# Patient Record
Sex: Male | Born: 1970 | Race: Black or African American | Hispanic: No | Marital: Married | Smoking: Current some day smoker
Health system: Southern US, Community
[De-identification: ages and names within clinical notes are randomized; demographics above are authoritative.]

## PROBLEM LIST (undated history)

## (undated) DIAGNOSIS — D573 Sickle-cell trait: Secondary | ICD-10-CM

## (undated) DIAGNOSIS — M109 Gout, unspecified: Secondary | ICD-10-CM

## (undated) DIAGNOSIS — I10 Essential (primary) hypertension: Secondary | ICD-10-CM

## (undated) HISTORY — DX: Sickle-cell trait: D57.3

## (undated) HISTORY — PX: NECK SURGERY: SHX720

---

## 2015-05-18 ENCOUNTER — Emergency Department (HOSPITAL_COMMUNITY)
Admission: EM | Admit: 2015-05-18 | Discharge: 2015-05-19 | Disposition: A | Discharge status: Home / Self Care | Payer: 59 | Attending: Emergency Medicine | Admitting: Emergency Medicine

## 2015-05-18 ENCOUNTER — Emergency Department (HOSPITAL_COMMUNITY): Payer: 59

## 2015-05-18 ENCOUNTER — Encounter (HOSPITAL_COMMUNITY): Payer: Self-pay | Admitting: *Deleted

## 2015-05-18 DIAGNOSIS — Y92410 Unspecified street and highway as the place of occurrence of the external cause: Secondary | ICD-10-CM | POA: Insufficient documentation

## 2015-05-18 DIAGNOSIS — F17210 Nicotine dependence, cigarettes, uncomplicated: Secondary | ICD-10-CM | POA: Insufficient documentation

## 2015-05-18 DIAGNOSIS — S3992XA Unspecified injury of lower back, initial encounter: Secondary | ICD-10-CM | POA: Insufficient documentation

## 2015-05-18 DIAGNOSIS — I10 Essential (primary) hypertension: Secondary | ICD-10-CM | POA: Insufficient documentation

## 2015-05-18 DIAGNOSIS — S2231XA Fracture of one rib, right side, initial encounter for closed fracture: Secondary | ICD-10-CM | POA: Insufficient documentation

## 2015-05-18 DIAGNOSIS — Y9389 Activity, other specified: Secondary | ICD-10-CM | POA: Diagnosis not present

## 2015-05-18 DIAGNOSIS — Y998 Other external cause status: Secondary | ICD-10-CM | POA: Diagnosis not present

## 2015-05-18 DIAGNOSIS — S0990XA Unspecified injury of head, initial encounter: Secondary | ICD-10-CM | POA: Diagnosis present

## 2015-05-18 DIAGNOSIS — S12691A Other nondisplaced fracture of seventh cervical vertebra, initial encounter for closed fracture: Secondary | ICD-10-CM | POA: Diagnosis not present

## 2015-05-18 DIAGNOSIS — S129XXA Fracture of neck, unspecified, initial encounter: Secondary | ICD-10-CM

## 2015-05-18 DIAGNOSIS — T1490XA Injury, unspecified, initial encounter: Secondary | ICD-10-CM

## 2015-05-18 DIAGNOSIS — S060X9A Concussion with loss of consciousness of unspecified duration, initial encounter: Secondary | ICD-10-CM | POA: Diagnosis not present

## 2015-05-18 HISTORY — DX: Essential (primary) hypertension: I10

## 2015-05-18 LAB — I-STAT CHEM 8, ED
BUN: 15 mg/dL (ref 6–20)
CALCIUM ION: 1.11 mmol/L — AB (ref 1.12–1.23)
CHLORIDE: 102 mmol/L (ref 101–111)
Creatinine, Ser: 1.5 mg/dL — ABNORMAL HIGH (ref 0.61–1.24)
Glucose, Bld: 106 mg/dL — ABNORMAL HIGH (ref 65–99)
HCT: 41 % (ref 39.0–52.0)
HEMOGLOBIN: 13.9 g/dL (ref 13.0–17.0)
POTASSIUM: 4.1 mmol/L (ref 3.5–5.1)
SODIUM: 141 mmol/L (ref 135–145)
TCO2: 26 mmol/L (ref 0–100)

## 2015-05-18 LAB — CBC WITH DIFFERENTIAL/PLATELET
BASOS ABS: 0.1 10*3/uL (ref 0.0–0.1)
Basophils Relative: 1 %
EOS ABS: 0.2 10*3/uL (ref 0.0–0.7)
EOS PCT: 2 %
HCT: 37.3 % — ABNORMAL LOW (ref 39.0–52.0)
Hemoglobin: 12.5 g/dL — ABNORMAL LOW (ref 13.0–17.0)
Lymphocytes Relative: 32 %
Lymphs Abs: 2.6 10*3/uL (ref 0.7–4.0)
MCH: 31.1 pg (ref 26.0–34.0)
MCHC: 33.5 g/dL (ref 30.0–36.0)
MCV: 92.8 fL (ref 78.0–100.0)
Monocytes Absolute: 0.5 10*3/uL (ref 0.1–1.0)
Monocytes Relative: 7 %
Neutro Abs: 4.8 10*3/uL (ref 1.7–7.7)
Neutrophils Relative %: 58 %
PLATELETS: 178 10*3/uL (ref 150–400)
RBC: 4.02 MIL/uL — AB (ref 4.22–5.81)
RDW: 11.8 % (ref 11.5–15.5)
WBC: 8.2 10*3/uL (ref 4.0–10.5)

## 2015-05-18 LAB — ETHANOL: ALCOHOL ETHYL (B): 255 mg/dL — AB (ref <5)

## 2015-05-18 MED ORDER — IOPAMIDOL (ISOVUE-300) INJECTION 61%
INTRAVENOUS | Status: AC
  Administered 2015-05-18: 100 mL
  Filled 2015-05-18: qty 100
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
## 2015-05-19 ENCOUNTER — Emergency Department (HOSPITAL_COMMUNITY): Payer: 59

## 2015-05-19 ENCOUNTER — Encounter (HOSPITAL_COMMUNITY): Payer: Self-pay | Admitting: Radiology

## 2015-05-19 MED ORDER — AMLODIPINE BESYLATE 5 MG PO TABS: 5.0000 mg | ORAL_TABLET | Freq: Once | ORAL | Status: DC

## 2015-05-19 MED ORDER — IOPAMIDOL (ISOVUE-370) INJECTION 76%
INTRAVENOUS | Status: AC
  Administered 2015-05-19: 50 mL
  Filled 2015-05-19: qty 50

## 2015-05-19 MED ORDER — FENTANYL CITRATE (PF) 100 MCG/2ML IJ SOLN
50.0000 ug | Freq: Once | INTRAMUSCULAR | Status: AC
  Administered 2015-05-19: 50 ug via INTRAVENOUS
  Filled 2015-05-19: qty 2

## 2015-05-19 MED ORDER — AMLODIPINE BESYLATE 10 MG PO TABS: 10.0000 mg | ORAL_TABLET | Freq: Every day | ORAL | Status: DC

## 2015-05-19 MED ORDER — ONDANSETRON 4 MG PO TBDP: 4.0000 mg | ORAL_TABLET | Freq: Three times a day (TID) | ORAL | Status: DC | PRN

## 2015-05-19 MED ORDER — FENTANYL CITRATE (PF) 100 MCG/2ML IJ SOLN
50.0000 ug | Freq: Once | INTRAMUSCULAR | Status: AC
Start: 2015-05-19 — End: 2015-05-19
  Administered 2015-05-19: 50 ug via INTRAVENOUS
  Filled 2015-05-19: qty 2

## 2015-05-19 MED ORDER — HYDROMORPHONE HCL 1 MG/ML IJ SOLN
1.0000 mg | Freq: Once | INTRAMUSCULAR | Status: AC
  Administered 2015-05-19: 1 mg via INTRAVENOUS
  Filled 2015-05-19: qty 1

## 2015-05-19 MED ORDER — SODIUM CHLORIDE 0.9 % IV BOLUS (SEPSIS)
1000.0000 mL | Freq: Once | INTRAVENOUS | Status: AC
  Administered 2015-05-19: 1000 mL via INTRAVENOUS

## 2015-05-19 MED ORDER — OXYCODONE-ACETAMINOPHEN 5-325 MG PO TABS: 1.0000 | ORAL_TABLET | Freq: Four times a day (QID) | ORAL | Status: DC | PRN

## 2015-05-19 MED ORDER — DOCUSATE SODIUM 100 MG PO CAPS: 100.0000 mg | ORAL_CAPSULE | Freq: Two times a day (BID) | ORAL | Status: DC

## 2015-05-19 MED ORDER — AMLODIPINE BESYLATE 5 MG PO TABS
10.0000 mg | ORAL_TABLET | Freq: Once | ORAL | Status: AC
  Administered 2015-05-19: 10 mg via ORAL
  Filled 2015-05-19: qty 2

## 2015-05-19 NOTE — ED Notes (Addendum)
Patient ambulated to the nurses station and back to the bed without difficulty  Tolerated well
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
## 2015-05-19 NOTE — ED Notes (Signed)
Discharge instructions and prescriptions reviewed with wife - voiced understanding  Instructed to follow up with neuro

## 2015-05-19 NOTE — Discharge Instructions (Signed)
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 have:  Numbness.  Tingling.  Weakness.  Loss of movement.  Loss of bowel or bladder control. DIAGNOSIS This injury may be diagnosed based on symptoms, medical history, and a physical exam.  You may also have imaging tests to confirm the diagnosis. These may include:  Spine X-ray.  CT scan.  MRI. TREATMENT Treatment for this injury depends on the type of fracture. If your fracture is stable and does not affect your spinal cord, it may heal with nonsurgical treatment, such as:  Taking pain medicine.  Wearing a cast or a brace.  Doing physical therapy exercises. If your vertebral fracture is unstable or it affects your spinal cord, you may need surgical treatment, such as:  Laminectomy. This procedure involves removing the part of a vertebra that is pushing on the spinal cord (spinal decompression surgery). Bone fragments may also be removed.  Spinal fusion.  This procedure is used to stabilize an unstable fracture. Vertebrae may be joined together with a piece of bone from another part of your body (graft) and held in place with rods, plates, or screws.  Vertebroplasty. In this procedure, bone cement is used to rebuild collapsed vertebrae. HOME CARE INSTRUCTIONS General Instructions  Take medicines only as directed by your health care provider.  Do not drive or operate heavy machinery while taking pain medicine.  If directed, apply ice to the injured area:  Put ice in a plastic bag.  Place a towel between your skin and the bag.  Leave the ice on for 30 minutes every two hours at first. Then apply the ice as needed.  Wear your neck brace or back brace as directed by your health care provider.  Do not drink alcohol. Alcohol can interfere with your treatment.  Keep all follow-up visits as directed by your health care provider. This is important. It can help to prevent permanent injury, disability, and long-lasting (chronic) pain. Activity  Stay in bed (on bed rest) only as directed by your health care provider. Being on bed rest for too long can make your condition worse.  Return to your normal activities as directed by your health care provider. Ask what activities are safe for you.  Do exercises to improve motion and strength in your back (physical therapy), as recommended by your health care provider.   Exercise regularly as directed by your health care provider. SEEK MEDICAL CARE IF:  You have a fever.  You develop a cough that makes your pain worse.  Your pain medicine is not helping.  Your pain does not get better over time.  You cannot return to your normal activities as planned or expected. SEEK IMMEDIATE MEDICAL CARE IF:  Your pain is very bad and it suddenly gets worse.  You are unable to move any body part (paralysis) that is below the level of your injury.  You have numbness, tingling, or weakness in any body  part that is below the level of your injury.  You cannot control your bladder or bowels.   This information is not intended to replace advice given to you by your health care provider. Make sure you discuss any questions you have with your health care provider.   Document Released: 03/04/2004 Document Revised: 06/11/2014 Document Reviewed: 01/30/2014 Elsevier Interactive Patient Education [employer]    To find a primary care or specialty doctor please call [phone] or [phone] to access "[place] Find a Doctor Service."  You may also go on the [facility] website at [email or web address]  There are also multiple [facility] and [facility] practices throughout the [place] that are frequently accepting new patients. You may find a clinic that is close to your home and contact them.  [facility] -  [address] [place] [address] [phone]  [facility] in [place] (also locations in [place] and [place]) -  [address] [place] [address] [phone]  [facility] -  [address] [place] [address] [phone]

## 2015-05-19 NOTE — ED Notes (Signed)
Pt taken to CT.

## 2015-06-26 ENCOUNTER — Other Ambulatory Visit: Payer: Self-pay | Admitting: Neurosurgery

## 2015-06-26 DIAGNOSIS — S12601A Unspecified nondisplaced fracture of seventh cervical vertebra, initial encounter for closed fracture: Secondary | ICD-10-CM

## 2015-07-04 ENCOUNTER — Ambulatory Visit
Admission: RE | Admit: 2015-07-04 | Discharge: 2015-07-04 | Disposition: A | Discharge status: Home / Self Care | Payer: 59 | Source: Ambulatory Visit | Attending: Neurosurgery | Admitting: Neurosurgery

## 2015-07-04 DIAGNOSIS — S12601A Unspecified nondisplaced fracture of seventh cervical vertebra, initial encounter for closed fracture: Secondary | ICD-10-CM

## 2016-05-01 ENCOUNTER — Emergency Department (HOSPITAL_BASED_OUTPATIENT_CLINIC_OR_DEPARTMENT_OTHER)
Admission: EM | Admit: 2016-05-01 | Discharge: 2016-05-01 | Disposition: A | Discharge status: Home / Self Care | Payer: Commercial Managed Care - PPO | Attending: Emergency Medicine | Admitting: Emergency Medicine

## 2016-05-01 ENCOUNTER — Emergency Department (HOSPITAL_BASED_OUTPATIENT_CLINIC_OR_DEPARTMENT_OTHER): Payer: Commercial Managed Care - PPO

## 2016-05-01 ENCOUNTER — Encounter (HOSPITAL_BASED_OUTPATIENT_CLINIC_OR_DEPARTMENT_OTHER): Payer: Self-pay | Admitting: Emergency Medicine

## 2016-05-01 DIAGNOSIS — F17290 Nicotine dependence, other tobacco product, uncomplicated: Secondary | ICD-10-CM | POA: Diagnosis not present

## 2016-05-01 DIAGNOSIS — Z79899 Other long term (current) drug therapy: Secondary | ICD-10-CM | POA: Insufficient documentation

## 2016-05-01 DIAGNOSIS — I10 Essential (primary) hypertension: Secondary | ICD-10-CM | POA: Insufficient documentation

## 2016-05-01 DIAGNOSIS — M79641 Pain in right hand: Secondary | ICD-10-CM | POA: Insufficient documentation

## 2016-05-01 HISTORY — DX: Gout, unspecified: M10.9

## 2016-05-01 MED ORDER — COLCHICINE 0.6 MG PO TABS
1.2000 mg | ORAL_TABLET | Freq: Once | ORAL | Status: AC
  Administered 2016-05-01: 1.2 mg via ORAL
  Filled 2016-05-01: qty 2

## 2016-05-01 MED ORDER — PREDNISONE 20 MG PO TABS: 40.0000 mg | ORAL_TABLET | Freq: Every day | ORAL | 0 refills | Status: DC

## 2016-05-01 MED ORDER — AMLODIPINE BESYLATE 10 MG PO TABS: 10.0000 mg | ORAL_TABLET | Freq: Every day | ORAL | 1 refills | Status: DC

## 2016-05-01 MED ORDER — HYDROCODONE-ACETAMINOPHEN 5-325 MG PO TABS: 1.0000 | ORAL_TABLET | Freq: Four times a day (QID) | ORAL | 0 refills | Status: DC | PRN

## 2016-05-01 MED ORDER — COLCHICINE 0.6 MG PO TABS: 0.6000 mg | ORAL_TABLET | Freq: Every day | ORAL | 0 refills | Status: DC

## 2016-05-01 NOTE — ED Triage Notes (Signed)
Swelling and pain to right thumb , states has gout and feels like prior gout pain but has only had flare ups to toes. Has not been on BP meds for a year

## 2016-05-01 NOTE — ED Provider Notes (Signed)
[facility] Provider Note   CSN: [id number] Arrival date & time: 05/01/16  0840     History   Chief Complaint Chief Complaint  Patient presents with  . Hand Pain    HPI Miguel Harrison is a 46 y.o. male.  HPI Miguel Harrison is a 46 y.o. male with history of gout and hypertension, presents to emergency department complaining of right wrist and right hand pain. Patient states symptoms started several weeks ago. It gradually gotten worse. Pain started in his right thumb and now radiates into the hand and into the wrist and forearm. Patient has swelling in that hand. Pain with movement at the wrist and all fingers. He has been taking Tylenol for this pain with no relief of his symptoms. He denies any injuries. He states he does do a lot of lifting and working with his hands. He reports that this pain is similar to a gout pain that he has had "numerous of times" in his feet. He denies ever having a flareup in his hand however. He denies any numbness or tingling in his fingers. Any movement or palpation makes pain worse. Nothing makes it better.  He denies any fever or chills. He denies any other associated symptoms.  Past Medical History:  Diagnosis Date  . Gout   . Hypertension     There are no active problems to display for this patient.   History reviewed. No pertinent surgical history.     Home Medications    Prior to Admission medications   Medication Sig Start Date End Date Taking? Authorizing Provider  amLODipine (NORVASC) 10 MG tablet Take 1 tablet (10 mg total) by mouth daily. 05/19/15   Kristen N Ward, DO  docusate sodium (COLACE) 100 MG capsule Take 1 capsule (100 mg total) by mouth every 12 (twelve) hours. 05/19/15   Kristen N Ward, DO  ibuprofen (ADVIL,MOTRIN) 200 MG tablet Take 200 mg by mouth every 6 (six) hours as needed for mild pain.    Historical Provider, MD  ondansetron (ZOFRAN ODT) 4 MG disintegrating tablet Take 1 tablet (4 mg total) by  mouth every 8 (eight) hours as needed for nausea or vomiting. 05/19/15   Kristen N Ward, DO  oxyCODONE-acetaminophen (PERCOCET/ROXICET) 5-325 MG tablet Take 1-2 tablets by mouth every 6 (six) hours as needed. 05/19/15   Layla Maw Ward, DO    Family History No family history on file.  Social History Social History  Substance Use Topics  . Smoking status: Current Some Day Smoker    Types: Cigars  . Smokeless tobacco: Never Used  . Alcohol use Yes     Comment: daily     Allergies   Patient has no known allergies.   Review of Systems Review of Systems  Constitutional: Negative for chills and fever.  Musculoskeletal: Positive for arthralgias and joint swelling.  Neurological: Negative for weakness and numbness.     Physical Exam Updated Vital Signs BP (!) 170/96 (BP Location: Left Arm)   Pulse 88   Temp 98.3 F (36.8 C) (Oral)   Resp 14   Ht 5\' 11"  (1.803 m)   Wt 88.5 kg   SpO2 100%   BMI 27.20 kg/m   Physical Exam  Constitutional: He appears well-developed and well-nourished. No distress.  HENT:  Head: Normocephalic and atraumatic.  Eyes: Conjunctivae are normal.  Neck: Neck supple.  Cardiovascular: Normal rate, regular rhythm and normal heart sounds.   Pulmonary/Chest: Effort normal. No respiratory distress. He has no wheezes.  He has no rales.  Musculoskeletal: He exhibits no edema.  Obvious swelling noted to the right hand and wrist. There is no erythema. It is not warm to the touch. Tenderness to the palmar surface of the flexor tendons of fingers 1 through 5. Tenderness over anterior wrist. Full range of motion of all fingers, pain with full extension of all of the fingers and MCP joints. Pain with wrist flexion and extension, full range of motion of the wrist.  Neurological: He is alert.  Skin: Skin is warm and dry.  Nursing note and vitals reviewed.    ED Treatments / Results  Labs (all labs ordered are listed, but only abnormal results are  displayed) Labs Reviewed - No data to display  EKG  EKG Interpretation None       Radiology Dg Hand Complete Right  Result Date: 05/01/2016 CLINICAL DATA:  Three-day history of pain and swelling EXAM: RIGHT HAND - COMPLETE 3+ VIEW COMPARISON:  None. FINDINGS: Frontal, oblique, and lateral views were obtained. There is no appreciable fracture or dislocation. Joint spaces appear normal. No erosive change or periostitis. Bony mineralization is normal. IMPRESSION: No apparent arthropathic change.  No fracture or dislocation. Electronically Signed   By: Bretta BangWilliam  Woodruff III M.D.   On: 05/01/2016 09:46    Procedures Procedures (including critical care time)  Medications Ordered in ED Medications - No data to display   Initial Impression / Assessment and Plan / ED Course  I have reviewed the triage vital signs and the nursing notes.  Pertinent labs & imaging results that were available during my care of the patient were reviewed by me and considered in my medical decision making (see chart for details).     Patient in emergency department with a right hand pain and swelling. Those for several weeks. His x-ray is unremarkable. His hand is not warm to the touch, he denies any fever or chills at home. I doubt septic arthritis or tenosynovitis. There is no wounds or skin lesions to the hand. He states pain spell similar to prior gout. We will start him on colchicine, norco for pain, prednisone, splint applied, will follow up with pcp. Patient's blood pressure elevated in emergency department. He is asymptomatic for his hypertension. He has been off of his medications for almost a year. He has been taking Norvasc in the past. I will restart his blood pressure meds. Return precautions discussed.   Vitals:   05/01/16 0849  BP: (!) 170/96  Pulse: 88  Resp: 14  Temp: 98.3 F (36.8 C)     Final Clinical Impressions(s) / ED Diagnoses   Final diagnoses:  Right hand pain    New  Prescriptions New Prescriptions   AMLODIPINE (NORVASC) 10 MG TABLET    Take 1 tablet (10 mg total) by mouth daily.   COLCHICINE 0.6 MG TABLET    Take 1 tablet (0.6 mg total) by mouth daily.   HYDROCODONE-ACETAMINOPHEN (NORCO) 5-325 MG TABLET    Take 1 tablet by mouth every 6 (six) hours as needed for moderate pain.   PREDNISONE (DELTASONE) 20 MG TABLET    Take 2 tablets (40 mg total) by mouth daily.     Jaynie Crumbleatyana Jazlyne Gauger, PA-C 05/01/16 1007    Melene Planan Floyd, DO 05/01/16 1503

## 2016-05-01 NOTE — Discharge Instructions (Signed)
Splint on until improved. Avoid heavy lifting. Ice and elevate at home. Take one more dose of colchicine in 1 hr. Take prednisone as prescribed until all gone starting tomorrow. Take norco for pain as needed. Follow up if not improving.

## 2016-05-21 ENCOUNTER — Telehealth: Payer: Self-pay

## 2016-05-21 MED ORDER — COLCHICINE 0.6 MG PO TABS: 0.6000 mg | ORAL_TABLET | Freq: Every day | ORAL | 0 refills | Status: DC

## 2016-05-21 NOTE — Telephone Encounter (Signed)
He takes colchicine daily? Does he need more than 1?

## 2016-05-21 NOTE — Telephone Encounter (Signed)
OK 

## 2016-05-21 NOTE — Telephone Encounter (Signed)
Patient states he 2 in the hospital then took another one and then was sent home with only 1 tablet and was told to follow up with PCP.  Patient has new patient appointment with you on Monday.

## 2016-05-21 NOTE — Telephone Encounter (Signed)
Patient has new pt appt with you on Monday and needs gout meds sent to pharmacy    Please advise  PC

## 2016-05-21 NOTE — Telephone Encounter (Signed)
Medication has been faxed over to the pharmacy.   PC

## 2016-05-21 NOTE — Telephone Encounter (Signed)
Call back number [phone] Kara Dies (wife)

## 2016-05-21 NOTE — Telephone Encounter (Signed)
Spouse checking on the status of medication below [phone], please advise

## 2016-05-24 ENCOUNTER — Ambulatory Visit: Payer: 59 | Admitting: Family Medicine

## 2016-06-18 ENCOUNTER — Other Ambulatory Visit: Payer: Self-pay | Admitting: Family Medicine

## 2016-06-18 NOTE — Telephone Encounter (Signed)
Rx denied.  Pt has never been seen by Dr. Carmelia RollerWendling.  Pt canceled his appointment.  Pt needs to schedule an appointment.//AB/CMA

## 2016-11-14 ENCOUNTER — Encounter (HOSPITAL_BASED_OUTPATIENT_CLINIC_OR_DEPARTMENT_OTHER): Payer: Self-pay

## 2016-11-14 ENCOUNTER — Emergency Department (HOSPITAL_BASED_OUTPATIENT_CLINIC_OR_DEPARTMENT_OTHER)
Admission: EM | Admit: 2016-11-14 | Discharge: 2016-11-14 | Disposition: A | Discharge status: Home / Self Care | Payer: Commercial Managed Care - PPO | Attending: Emergency Medicine | Admitting: Emergency Medicine

## 2016-11-14 DIAGNOSIS — I10 Essential (primary) hypertension: Secondary | ICD-10-CM | POA: Insufficient documentation

## 2016-11-14 DIAGNOSIS — F17290 Nicotine dependence, other tobacco product, uncomplicated: Secondary | ICD-10-CM | POA: Insufficient documentation

## 2016-11-14 DIAGNOSIS — R2231 Localized swelling, mass and lump, right upper limb: Secondary | ICD-10-CM | POA: Diagnosis not present

## 2016-11-14 DIAGNOSIS — Z79899 Other long term (current) drug therapy: Secondary | ICD-10-CM | POA: Diagnosis not present

## 2016-11-14 DIAGNOSIS — M109 Gout, unspecified: Secondary | ICD-10-CM | POA: Diagnosis not present

## 2016-11-14 DIAGNOSIS — M79641 Pain in right hand: Secondary | ICD-10-CM | POA: Diagnosis present

## 2016-11-14 LAB — BASIC METABOLIC PANEL
ANION GAP: 7 (ref 5–15)
BUN: 15 mg/dL (ref 6–20)
CHLORIDE: 100 mmol/L — AB (ref 101–111)
CO2: 27 mmol/L (ref 22–32)
Calcium: 8.6 mg/dL — ABNORMAL LOW (ref 8.9–10.3)
Creatinine, Ser: 1.01 mg/dL (ref 0.61–1.24)
GFR calc non Af Amer: >60 mL/min (ref >60)
Glucose, Bld: 116 mg/dL — ABNORMAL HIGH (ref 65–99)
Potassium: 3.8 mmol/L (ref 3.5–5.1)
Sodium: 134 mmol/L — ABNORMAL LOW (ref 135–145)

## 2016-11-14 MED ORDER — AMLODIPINE BESYLATE 10 MG PO TABS: 10.0000 mg | ORAL_TABLET | Freq: Every day | ORAL | 0 refills | Status: DC

## 2016-11-14 MED ORDER — OXYCODONE-ACETAMINOPHEN 5-325 MG PO TABS: 1.0000 | ORAL_TABLET | Freq: Three times a day (TID) | ORAL | 0 refills | Status: DC | PRN

## 2016-11-14 MED ORDER — COLCHICINE 0.6 MG PO TABS: ORAL_TABLET | ORAL | 0 refills | Status: DC

## 2016-11-14 MED ORDER — OXYCODONE-ACETAMINOPHEN 5-325 MG PO TABS
1.0000 | ORAL_TABLET | Freq: Once | ORAL | Status: AC
  Administered 2016-11-14: 1 via ORAL
  Filled 2016-11-14: qty 1

## 2016-11-14 NOTE — Discharge Instructions (Signed)
Please take 2 tablets of colchicine, followed by one additional tablet the next hour today for your your gout flare. Starting tomorrow you may take one tablet in the morning and a second tablet at night (if needed) until your gout flare resolves. If you're still having pain and symptoms in the next 4 days, please call the number on her discharge paperwork to get established with a primary care provider for a recheck.  You may take Tylenol or ibuprofen as directed on the bottle for additional pain control. For severe pain he may take 1 tablet Percocet once every 8 hours. Please do not drive or work while taking this medication because it can cause you to be impaired and sleepy. Please note, this is a narcotic medication can be addicting so please transition to ibuprofen or Tylenol as soon as possible.  Your creatinine level, which is an indicator of her kidney function, was normal today. Your blood pressure was 181/100. Please call the number on her discharge paper to get established with a primary care provider. I have refilled your Norvasc for one month. Please follow-up with primary care to obtain additional refills of this medication.

## 2016-11-14 NOTE — ED Provider Notes (Signed)
[facility] Note   CSN: [id number] Arrival date & time: 11/14/16  0855     History   Chief Complaint Chief Complaint  Patient presents with  . Hand Pain    HPI Miguel Harrison is a 46 y.o. male with a history of hypertension and gout who presents to the emergency department with a chief complaint of constant, worsening pain and swelling to the right hand since yesterday. The patient reports this feels like one of his gout flares. He denies fever, chills, or recent trauma or injury to the right hand, wrist, or fingers. He reports the pain started over the third MCP, but has worsened and now includes the second MCP. No treatment PTA. He reports he is out of his gout medication and his HTN medication. No PCP.   The history is provided by the patient. No language interpreter was used.    Past Medical History:  Diagnosis Date  . Gout   . Hypertension     There are no active problems to display for this patient.   Past Surgical History:  Procedure Laterality Date  . NECK SURGERY         Home Medications    Prior to Admission medications   Medication Sig Start Date End Date Taking? Authorizing Provider  amLODipine (NORVASC) 10 MG tablet Take 1 tablet (10 mg total) by mouth daily. 11/14/16   Herron Fero A, PA-C  colchicine 0.6 MG tablet Day 1: Take 2 tablets followed by one additional tablet in 1 hours for a total of 3 tablets.  After the first day, take 1-2 tablets daily until your gout flare resolves. 11/14/16   Lauralie Blacksher A, PA-C  docusate sodium (COLACE) 100 MG capsule Take 1 capsule (100 mg total) by mouth every 12 (twelve) hours. 05/19/15   Ward, Layla Maw, DO  HYDROcodone-acetaminophen (NORCO) 5-325 MG tablet Take 1 tablet by mouth every 6 (six) hours as needed for moderate pain. 05/01/16   Kirichenko, Tatyana, PA-C  ibuprofen (ADVIL,MOTRIN) 200 MG tablet Take 200 mg by mouth every 6 (six) hours as needed for mild pain.    [provider]  ondansetron (ZOFRAN ODT) 4 MG disintegrating tablet Take 1 tablet (4 mg total) by mouth every 8 (eight) hours as needed for nausea or vomiting. 05/19/15   Ward, Layla Maw, DO  oxyCODONE-acetaminophen (PERCOCET/ROXICET) 5-325 MG tablet Take 1 tablet by mouth every 8 (eight) hours as needed for severe pain. 11/14/16   Jovante Hammitt A, PA-C  predniSONE (DELTASONE) 20 MG tablet Take 2 tablets (40 mg total) by mouth daily. 05/01/16   Jaynie Crumble, PA-C    Family History History reviewed. No pertinent family history.  Social History Social History  Substance Use Topics  . Smoking status: Current Some Day Smoker    Types: Cigars  . Smokeless tobacco: Never Used  . Alcohol use Yes     Comment: daily     Allergies   Patient has no known allergies.   Review of Systems Review of Systems  Constitutional: Negative for activity change, chills and fever.  Respiratory: Negative for shortness of breath.   Cardiovascular: Negative for chest pain.  Gastrointestinal: Negative for abdominal pain.  Musculoskeletal: Positive for arthralgias and joint swelling. Negative for back pain and myalgias.  Skin: Negative for rash.  Neurological: Negative for weakness and numbness.     Physical Exam Updated Vital Signs BP (!) 181/100 (BP Location: Left Arm)   Pulse 80   Temp 98.1 F (  36.7 C) (Oral)   Resp 16   Ht  (1.778 m)   Wt 89.8 kg (198 lb)   SpO2 98%   BMI 28.41 kg/m   Physical Exam  Constitutional: He appears well-developed.  HENT:  Head: Normocephalic.  Eyes: Conjunctivae are normal.  Neck: Neck supple.  Cardiovascular: Normal rate and regular rhythm.   No murmur heard. Pulmonary/Chest: Effort normal.  Abdominal: Soft. He exhibits no distension.  Musculoskeletal:  TPP over the right second and third MCP joints of the right hand. The joints are mildly warm and swollen. FROM of the digits on the hand. Radial pulses 2+ bilaterally. Sensation is intact throughout. FROM of  the elbow. Normal left hand and elbow exam.   Neurological: He is alert.  Skin: Skin is warm and dry.  Psychiatric: His behavior is normal.  Nursing note and vitals reviewed.    ED Treatments / Results  Labs (all labs ordered are listed, but only abnormal results are displayed) Labs Reviewed  BASIC METABOLIC PANEL - Abnormal; Notable for the following:       Result Value   Sodium 134 (*)    Chloride 100 (*)    Glucose, Bld 116 (*)    Calcium 8.6 (*)    All other components within normal limits    EKG  EKG Interpretation None       Radiology No results found.  Procedures Procedures (including critical care time)  Medications Ordered in ED Medications  oxyCODONE-acetaminophen (PERCOCET/ROXICET) 5-325 MG per tablet 1 tablet (1 tablet Oral Given 11/14/16 0930)     Initial Impression / Assessment and Plan / ED Course  I have reviewed the triage vital signs and the nursing notes.  Pertinent labs & imaging results that were available during my care of the patient were reviewed by me and considered in my medical decision making (see chart for details).     46 year old male with a history of gout and hypertension. His blood pressure is 181/100 today. He reports that he has been out of his Norvasc for several months because he has not been reestablished with a primary care provider. When the patient was last evaluated in the ED in April 2017, the patient's creatinine was 1.5. The patient denies a history of renal disease. We will repeat a BMP today prior to discharge for gout; Cr 1.01. Discussed with the patient that this does not appear to be a septic joint, but only an arthrocentesis would rule it out; however I have a low suspicion for gout at this time. He denies arthrocentesis at this time. Will d/c to home with pain medication and colchicine. A 42-month prescription history query was performed using the [place] CSRS prior to discharge. The patient was also given a one month  supply of Norvasc. Encouraged him to get established with a PCP. Strict return precautions given. NAD. The patient is safe for d/c at this time.   Final Clinical Impressions(s) / ED Diagnoses   Final diagnoses:  Acute gout of right hand, unspecified cause    New Prescriptions Discharge Medication List as of 11/14/2016 10:18 AM       Barkley Boards, PA-C 11/15/16 2121    Arby Barrette, MD 11/16/16 1543

## 2016-11-14 NOTE — ED Triage Notes (Addendum)
Patient reports right hand pain r/t Gout for 2 days, states he ran out of meds. Denies injury. States out of Norvasc for known HTN.

## 2016-12-29 ENCOUNTER — Telehealth: Payer: Self-pay | Admitting: Family Medicine

## 2016-12-29 ENCOUNTER — Other Ambulatory Visit: Payer: Self-pay | Admitting: Family Medicine

## 2016-12-29 ENCOUNTER — Encounter: Payer: Self-pay | Admitting: Family Medicine

## 2016-12-29 ENCOUNTER — Ambulatory Visit (INDEPENDENT_AMBULATORY_CARE_PROVIDER_SITE_OTHER): Payer: Commercial Managed Care - PPO | Admitting: Family Medicine

## 2016-12-29 VITALS — BP 134/90 | HR 93 | Temp 98.3°F | Ht 70.0 in | Wt 205.0 lb

## 2016-12-29 DIAGNOSIS — M109 Gout, unspecified: Secondary | ICD-10-CM | POA: Diagnosis not present

## 2016-12-29 DIAGNOSIS — I10 Essential (primary) hypertension: Secondary | ICD-10-CM

## 2016-12-29 LAB — BASIC METABOLIC PANEL
BUN: 19 mg/dL (ref 6–23)
CALCIUM: 9.4 mg/dL (ref 8.4–10.5)
CHLORIDE: 100 meq/L (ref 96–112)
CO2: 30 meq/L (ref 19–32)
Creatinine, Ser: 1.1 mg/dL (ref 0.40–1.50)
GFR: 91.68 mL/min (ref >60.00)
GLUCOSE: 113 mg/dL — AB (ref 70–99)
POTASSIUM: 4.3 meq/L (ref 3.5–5.1)
SODIUM: 136 meq/L (ref 135–145)

## 2016-12-29 LAB — URIC ACID: URIC ACID, SERUM: 8.7 mg/dL — AB (ref 4.0–7.8)

## 2016-12-29 MED ORDER — AMLODIPINE BESYLATE 10 MG PO TABS: 10.0000 mg | ORAL_TABLET | Freq: Every day | ORAL | 2 refills | Status: DC

## 2016-12-29 MED ORDER — ALLOPURINOL 100 MG PO TABS
100.0000 mg | ORAL_TABLET | Freq: Every day | ORAL | 6 refills | Status: DC
Start: 2016-12-29 — End: 2018-02-20

## 2016-12-29 MED ORDER — COLCHICINE 0.6 MG PO TABS: ORAL_TABLET | ORAL | 0 refills | Status: DC

## 2016-12-29 NOTE — Addendum Note (Signed)
Addended by: Scharlene GlossEWING, ROBIN B on: 12/29/2016 08:58 AM   Modules accepted: Orders

## 2016-12-29 NOTE — Progress Notes (Signed)
Allopurinol called in. Pt to be notified by our office.

## 2016-12-29 NOTE — Telephone Encounter (Signed)
Pt called and given lab results per notes of Dr. Carmelia RollerWendling. Also explained to pt that medications three medications were called in for pick up at the pharmacy, 2 for treating gout and one for blood pressure. Pt verbalized understanding. No further concerns voiced at this time.

## 2016-12-29 NOTE — Progress Notes (Signed)
Pre visit review using our clinic review tool, if applicable. No additional management support is needed unless otherwise documented below in the visit note. 

## 2016-12-29 NOTE — Progress Notes (Signed)
Chief Complaint  Patient presents with  . Establish Care    right hand pain       New Patient Visit SUBJECTIVE: HPI: Miguel Harrison is an 46 y.o.male who is being seen for establishing care.   The patient has not recently had a primary care physician.  He has a 10-year history of gout.  Normally it affects his right great toe, however since the beginning of the year, has been affecting his right thumb.  He did not adhere to a gout friendly diet.  He eats whatever he wishes.  He used to be on a daily medicine, however stopped taking it due to poor continuity of care.  He denies any injury or change in activity.  He denies any redness or swelling.  Hypertension Patient presents for hypertension follow up. He does not monitor home blood pressures. He was taking Norvasc, however ran out 3 weeks ago.  He denies any side effects when taking it. He is not adhering to a healthy diet overall. Exercise: none    No Known Allergies  Past Medical History:  Diagnosis Date  . Gout   . Hypertension    Past Surgical History:  Procedure Laterality Date  . NECK SURGERY     Social History   Socioeconomic History  . Marital status: Married  Tobacco Use  . Smoking status: Current Some Day Smoker    Types: Cigars  . Smokeless tobacco: Never Used  Substance and Sexual Activity  . Alcohol use: Yes    Comment: daily  . Drug use: No   Family History  Problem Relation Age of Onset  . Hypertension Mother     Current Outpatient Medications:  .  amLODipine (NORVASC) 10 MG tablet, Take 1 tablet (10 mg total) by mouth daily., Disp: 30 tablet, Rfl: 0 .  colchicine 0.6 MG tablet, Day 1: Take 2 tabs followed by one additional tab in 1 hrs for a total of 3 tabs. The next day, take 1 tab daily until gout flare resolves., Disp: 16 tablet, Rfl: 0 .  ibuprofen (ADVIL,MOTRIN) 200 MG tablet, Take 200 mg by mouth every 6 (six) hours as needed for mild pain., Disp: , Rfl:   ROS Cardiovascular: Denies  chest pain  Respiratory: Denies dyspnea   OBJECTIVE: BP 134/90 (BP Location: Left Arm, Patient Position: Sitting, Cuff Size: Large)   Pulse 93   Temp 98.3 F (36.8 C) (Oral)   Ht 5\' 10"  (1.778 m)   Wt 205 lb (93 kg)   SpO2 96%   BMI 29.41 kg/m   Constitutional: -  VS reviewed -  Well developed, well nourished, appears stated age -  No apparent distress  Psychiatric: -  Oriented to person, place, and time -  Memory intact -  Affect and mood normal -  Fluent conversation, good eye contact -  Judgment and insight age appropriate  Eye: -  Conjunctivae clear, no discharge -  Pupils symmetric, round, reactive to light  ENMT: -  MMM    Pharynx moist, no exudate, no erythema  Neck: -  No gross swelling, no palpable masses -  Thyroid midline, not enlarged, mobile, no palpable masses  Cardiovascular: -  RRR -  No LE edema  Respiratory: -  Normal respiratory effort, no accessory muscle use, no retraction -  Breath sounds equal, no wheezes, no ronchi, no crackles  Gastrointestinal: -  Bowel sounds normal -  No tenderness, no distention, no guarding, no masses  Musculoskeletal: -  +TTP over  1st MCP. No edema or erythema -  Good ROM  Skin: -  No significant lesion on inspection -  Warm and dry to palpation   ASSESSMENT/PLAN: Acute gout of right hand, unspecified cause - Plan: Uric acid, Basic metabolic panel  Essential hypertension  Refill Colchicine for flare and Norvasc. Counseled on diet and exercise. Diet for gout management given. Will check above labs.  May need to start allopurinol. Follow-up in 1 month. The patient voiced understanding and agreement to the plan.   Jilda Rocheicholas Paul ClintonWendling, DO 12/29/16  8:54 AM

## 2016-12-29 NOTE — Patient Instructions (Addendum)
Give us 2-3 business days to get the results of your labs back. If labs are normal, you will likely receive a letter in the mail unless you have MyChart. This can take longer than 2-3 business days.   Ice/cold pack over area for 10-15 min every 2-3 hours while awake.  Keep the diet clean.   Let us know if you need anything.

## 2017-01-04 ENCOUNTER — Telehealth: Payer: Self-pay | Admitting: Family Medicine

## 2017-01-04 NOTE — Telephone Encounter (Signed)
Noted  

## 2017-01-04 NOTE — Telephone Encounter (Signed)
Lab results given to patient with verbal understanding and also recommendation of new medication.

## 2017-01-06 ENCOUNTER — Telehealth: Payer: Self-pay | Admitting: Family Medicine

## 2017-01-06 NOTE — Telephone Encounter (Signed)
Left message on pt.'s voicemail that his gout medicine was sent to his pharmacy.

## 2017-01-06 NOTE — Telephone Encounter (Signed)
Copied from CRM [phone]. Topic: Quick Communication - See Telephone Encounter >> Jan 06, 2017  9:51 AM Oneal GroutSebastian, Jennifer S wrote: CRM for notification. See Telephone encounter for: Requesting prescription of Uric Acid level. Please advise  01/06/17.

## 2017-01-28 ENCOUNTER — Ambulatory Visit: Payer: Commercial Managed Care - PPO | Admitting: Family Medicine

## 2017-12-22 ENCOUNTER — Encounter: Payer: Commercial Managed Care - PPO | Admitting: Family Medicine

## 2018-01-19 ENCOUNTER — Encounter: Payer: Self-pay | Admitting: Family Medicine

## 2018-01-19 ENCOUNTER — Ambulatory Visit (INDEPENDENT_AMBULATORY_CARE_PROVIDER_SITE_OTHER): Payer: Managed Care, Other (non HMO) | Admitting: Family Medicine

## 2018-01-19 VITALS — BP 146/100 | HR 75 | Temp 98.3°F | Ht 70.0 in | Wt 200.0 lb

## 2018-01-19 DIAGNOSIS — M1A9XX0 Chronic gout, unspecified, without tophus (tophi): Secondary | ICD-10-CM

## 2018-01-19 DIAGNOSIS — Z23 Encounter for immunization: Secondary | ICD-10-CM | POA: Diagnosis not present

## 2018-01-19 DIAGNOSIS — I10 Essential (primary) hypertension: Secondary | ICD-10-CM | POA: Insufficient documentation

## 2018-01-19 DIAGNOSIS — Z0000 Encounter for general adult medical examination without abnormal findings: Secondary | ICD-10-CM

## 2018-01-19 DIAGNOSIS — Z114 Encounter for screening for human immunodeficiency virus [HIV]: Secondary | ICD-10-CM

## 2018-01-19 DIAGNOSIS — Z125 Encounter for screening for malignant neoplasm of prostate: Secondary | ICD-10-CM | POA: Diagnosis not present

## 2018-01-19 LAB — LIPID PANEL
CHOL/HDL RATIO: 2
CHOLESTEROL: 165 mg/dL (ref 0–200)
HDL: 102.7 mg/dL (ref >39.00)
LDL CALC: 43 mg/dL (ref 0–99)
NONHDL: 62.2
Triglycerides: 94 mg/dL (ref 0.0–149.0)
VLDL: 18.8 mg/dL (ref 0.0–40.0)

## 2018-01-19 LAB — COMPREHENSIVE METABOLIC PANEL
ALBUMIN: 4.6 g/dL (ref 3.5–5.2)
ALK PHOS: 64 U/L (ref 39–117)
ALT: 61 U/L — AB (ref 0–53)
AST: 73 U/L — AB (ref 0–37)
BUN: 15 mg/dL (ref 6–23)
CO2: 25 mEq/L (ref 19–32)
CREATININE: 0.96 mg/dL (ref 0.40–1.50)
Calcium: 9.2 mg/dL (ref 8.4–10.5)
Chloride: 99 mEq/L (ref 96–112)
GFR: 106.8 mL/min (ref >60.00)
Glucose, Bld: 86 mg/dL (ref 70–99)
POTASSIUM: 4.4 meq/L (ref 3.5–5.1)
Sodium: 138 mEq/L (ref 135–145)
TOTAL PROTEIN: 7.7 g/dL (ref 6.0–8.3)
Total Bilirubin: 1.4 mg/dL — ABNORMAL HIGH (ref 0.2–1.2)

## 2018-01-19 LAB — CBC
HCT: 38.3 % — ABNORMAL LOW (ref 39.0–52.0)
HEMOGLOBIN: 13.3 g/dL (ref 13.0–17.0)
MCHC: 34.8 g/dL (ref 30.0–36.0)
MCV: 101.2 fl — ABNORMAL HIGH (ref 78.0–100.0)
PLATELETS: 144 10*3/uL — AB (ref 150.0–400.0)
RBC: 3.79 Mil/uL — AB (ref 4.22–5.81)
RDW: 12.7 % (ref 11.5–15.5)
WBC: 5.3 10*3/uL (ref 4.0–10.5)

## 2018-01-19 LAB — PSA: PSA: 2.16 ng/mL (ref 0.10–4.00)

## 2018-01-19 LAB — URIC ACID: Uric Acid, Serum: 7.6 mg/dL (ref 4.0–7.8)

## 2018-01-19 NOTE — Patient Instructions (Signed)
Give us 2-3 business days to get the results of your labs back.   Aim to do some physical exertion for 150 minutes per week. This is typically divided into 5 days per week, 30 minutes per day. The activity should be enough to get your heart rate up. Anything is better than nothing if you have time constraints.  Healthy Eating Plan Many factors influence your heart health, including eating and exercise habits. Heart (coronary) risk increases with abnormal blood fat (lipid) levels. Heart-healthy meal planning includes limiting unhealthy fats, increasing healthy fats, and making other small dietary changes. This includes maintaining a healthy body weight to help keep lipid levels within a normal range.  WHAT IS MY PLAN?  Your health care provider recommends that you:  Drink a glass of water before meals to help with satiety.  Eat slowly.  An alternative to the water is to add Metamucil. This will help with satiety as well. It does contain calories, unlike water.  WHAT TYPES OF FAT SHOULD I CHOOSE?  Choose healthy fats more often. Choose monounsaturated and polyunsaturated fats, such as olive oil and canola oil, flaxseeds, walnuts, almonds, and seeds.  Eat more omega-3 fats. Good choices include salmon, mackerel, sardines, tuna, flaxseed oil, and ground flaxseeds. Aim to eat fish at least two times each week.  Avoid foods with partially hydrogenated oils in them. These contain trans fats. Examples of foods that contain trans fats are stick margarine, some tub margarines, cookies, crackers, and other baked goods. If you are going to avoid a fat, this is the one to avoid!  WHAT GENERAL GUIDELINES DO I NEED TO FOLLOW?  Check food labels carefully to identify foods with trans fats. Avoid these types of options when possible.  Fill one half of your plate with vegetables and green salads. Eat 4-5 servings of vegetables per day. A serving of vegetables equals 1 cup of raw leafy vegetables,  cup of  raw or cooked cut-up vegetables, or  cup of vegetable juice.  Fill one fourth of your plate with whole grains. Look for the word "whole" as the first word in the ingredient list.  Fill one fourth of your plate with lean protein foods.  Eat 4-5 servings of fruit per day. A serving of fruit equals one medium whole fruit,  cup of dried fruit,  cup of fresh, frozen, or canned fruit. Try to avoid fruits in cups/syrups as the sugar content can be high.  Eat more foods that contain soluble fiber. Examples of foods that contain this type of fiber are apples, broccoli, carrots, beans, peas, and barley. Aim to get 20-30 g of fiber per day.  Eat more home-cooked food and less restaurant, buffet, and fast food.  Limit or avoid alcohol.  Limit foods that are high in starch and sugar.  Avoid fried foods when able.  Cook foods by using methods other than frying. Baking, boiling, grilling, and broiling are all great options. Other fat-reducing suggestions include: ? Removing the skin from poultry. ? Removing all visible fats from meats. ? Skimming the fat off of stews, soups, and gravies before serving them. ? Steaming vegetables in water or broth.  Lose weight if you are overweight. Losing just 5-10% of your initial body weight can help your overall health and prevent diseases such as diabetes and heart disease.  Increase your consumption of nuts, legumes, and seeds to 4-5 servings per week. One serving of dried beans or legumes equals  cup after being cooked, one   serving of nuts equals 1 ounces, and one serving of seeds equals  ounce or 1 tablespoon.  WHAT ARE GOOD FOODS CAN I EAT? Grains Grainy breads (try to find bread that is 3 g of fiber per slice or greater), oatmeal, light popcorn. Whole-grain cereals. Rice and pasta, including brown rice and those that are made with whole wheat. Edamame pasta is a great alternative to grain pasta. It has a higher protein content. Try to avoid significant  consumption of white bread, sugary cereals, or pastries/baked goods.  Vegetables All vegetables. Cooked white potatoes do not count as vegetables.  Fruits All fruits, but limit pineapple and bananas as these fruits have a higher sugar content.  Meats and Other Protein Sources Lean, well-trimmed beef, veal, pork, and lamb. Chicken and turkey without skin. All fish and shellfish. Wild duck, rabbit, pheasant, and venison. Egg whites or low-cholesterol egg substitutes. Dried beans, peas, lentils, and tofu.Seeds and most nuts.  Dairy Low-fat or nonfat cheeses, including ricotta, string, and mozzarella. Skim or 1% milk that is liquid, powdered, or evaporated. Buttermilk that is made with low-fat milk. Nonfat or low-fat yogurt. Soy/Almond milk are good alternatives if you cannot handle dairy.  Beverages Water is the best for you. Sports drinks with less sugar are more desirable unless you are a highly active athlete.  Sweets and Desserts Sherbets and fruit ices. Honey, jam, marmalade, jelly, and syrups. Dark chocolate.  Eat all sweets and desserts in moderation.  Fats and Oils Nonhydrogenated (trans-free) margarines. Vegetable oils, including soybean, sesame, sunflower, olive, peanut, safflower, corn, canola, and cottonseed. Salad dressings or mayonnaise that are made with a vegetable oil. Limit added fats and oils that you use for cooking, baking, salads, and as spreads.  Other Cocoa powder. Coffee and tea. Most condiments.  The items listed above may not be a complete list of recommended foods or beverages. Contact your dietitian for more options.   

## 2018-01-19 NOTE — Addendum Note (Signed)
Addended by: Scharlene GlossEWING, Jamee Pacholski B on: 01/19/2018 08:41 AM   Modules accepted: Orders

## 2018-01-19 NOTE — Progress Notes (Signed)
Pre visit review using our clinic review tool, if applicable. No additional management support is needed unless otherwise documented below in the visit note. 

## 2018-01-19 NOTE — Progress Notes (Signed)
Chief Complaint  Patient presents with  . Annual Exam    Well Male Miguel Harrison is here for a complete physical.   His last physical was >1 year ago.  Current diet: in general, a "healthy" diet.   Current exercise: None Weight trend: stable Daytime fatigue? No. Seat belt? Yes.    Health maintenance Tetanus- No HIV- No  Past Medical History:  Diagnosis Date  . Gout   . Hypertension      Past Surgical History:  Procedure Laterality Date  . NECK SURGERY      Medications  Current Outpatient Medications on File Prior to Visit  Medication Sig Dispense Refill  . allopurinol (ZYLOPRIM) 100 MG tablet Take 1 tablet (100 mg total) by mouth daily. (Patient not taking: Reported on 01/19/2018) 30 tablet 6  . amLODipine (NORVASC) 10 MG tablet Take 1 tablet (10 mg total) by mouth daily. (Patient not taking: Reported on 01/19/2018) 30 tablet 2  . colchicine 0.6 MG tablet Day 1: Take 2 tabs followed by one additional tab in 1 hrs for a total of 3 tabs. The next day, take 1 tab daily until gout flare resolves. (Patient not taking: Reported on 01/19/2018) 16 tablet 0  . ibuprofen (ADVIL,MOTRIN) 200 MG tablet Take 200 mg by mouth every 6 (six) hours as needed for mild pain.     Allergies No Known Allergies  Family History Family History  Problem Relation Age of Onset  . Hypertension Mother     Review of Systems: Constitutional: no fevers or chills Eye:  no recent significant change in vision Ear/Nose/Mouth/Throat:  Ears:  no tinnitus or hearing loss Nose/Mouth/Throat:  no complaints of nasal congestion, no sore throat Cardiovascular:  no chest pain, no palpitations Respiratory:  no cough and no shortness of breath Gastrointestinal:  no abdominal pain, no change in bowel habits GU:  Male: negative for dysuria, frequency, and incontinence and negative for prostate symptoms Musculoskeletal/Extremities:  no pain, redness, or swelling of the joints Integumentary (Skin/Breast):  no  abnormal skin lesions reported Neurologic:  no headaches, no numbness, tingling Endocrine: No unexpected weight changes Hematologic/Lymphatic:  no night sweats  Exam BP (!) 146/100 (BP Location: Left Arm, Patient Position: Sitting, Cuff Size: Large)   Pulse 75   Temp 98.3 F (36.8 C) (Oral)   Ht 5\' 10"  (1.778 m)   Wt 200 lb (90.7 kg)   SpO2 95%   BMI 28.70 kg/m  General:  well developed, well nourished, in no apparent distress Skin:  no significant moles, warts, or growths Head:  no masses, lesions, or tenderness Eyes:  pupils equal and round, sclera anicteric without injection Ears:  canals without lesions, TMs shiny without retraction, no obvious effusion, no erythema Nose:  nares patent, septum midline, mucosa normal Throat/Pharynx:  lips and gingiva without lesion; tongue and uvula midline; non-inflamed pharynx; no exudates or postnasal drainage Neck: neck supple without adenopathy, thyromegaly, or masses Lungs:  clear to auscultation, breath sounds equal bilaterally, no respiratory distress Cardio:  regular rate and rhythm, no bruits, no LE edema Abdomen:  abdomen soft, nontender; bowel sounds normal; no masses or organomegaly Genital (male): circumcised penis, no lesions or discharge; testes present bilaterally without masses or tenderness Rectal: Deferred Musculoskeletal:  symmetrical muscle groups noted without atrophy or deformity Extremities:  no clubbing, cyanosis, or edema, no deformities, no skin discoloration Neuro:  gait normal; deep tendon reflexes normal and symmetric Psych: well oriented with normal range of affect and appropriate judgment/insight  Assessment and Plan  Well adult exam - Plan: CBC, Comprehensive metabolic panel, Lipid panel  Screening for prostate cancer - Plan: PSA  Essential hypertension  Chronic gout involving toe without tophus, unspecified cause, unspecified laterality - Plan: Uric acid  Screening for HIV (human immunodeficiency virus)  - Plan: HIV Antibody (routine testing w rflx)   Well 47 y.o. male. Counseled on diet and exercise.  Healthy diet handout provided.  Start taking blood pressure medicine again. Counseled on risks and benefits of PSA testing for prostate cancer screening.  He agrees to undergo testing. Other orders as above.  He declines a flu shot. Follow up in 1 mo pending the above workup. The patient voiced understanding and agreement to the plan.  [name], DO 01/19/18 8:32 AM

## 2018-01-20 ENCOUNTER — Other Ambulatory Visit: Payer: Self-pay | Admitting: Family Medicine

## 2018-01-20 DIAGNOSIS — R7401 Elevation of levels of liver transaminase levels: Secondary | ICD-10-CM

## 2018-01-20 DIAGNOSIS — R740 Nonspecific elevation of levels of transaminase and lactic acid dehydrogenase [LDH]: Secondary | ICD-10-CM

## 2018-01-20 DIAGNOSIS — D539 Nutritional anemia, unspecified: Secondary | ICD-10-CM

## 2018-01-20 LAB — HIV ANTIBODY (ROUTINE TESTING W REFLEX): HIV: NONREACTIVE

## 2018-01-23 ENCOUNTER — Encounter: Payer: Self-pay | Admitting: Family Medicine

## 2018-02-20 ENCOUNTER — Ambulatory Visit (INDEPENDENT_AMBULATORY_CARE_PROVIDER_SITE_OTHER): Payer: Managed Care, Other (non HMO) | Admitting: Family Medicine

## 2018-02-20 ENCOUNTER — Encounter: Payer: Self-pay | Admitting: Family Medicine

## 2018-02-20 VITALS — BP 142/76 | HR 98 | Temp 98.3°F | Ht 70.0 in | Wt 198.6 lb

## 2018-02-20 DIAGNOSIS — I10 Essential (primary) hypertension: Secondary | ICD-10-CM | POA: Diagnosis not present

## 2018-02-20 MED ORDER — AMLODIPINE BESYLATE 10 MG PO TABS: 10.0000 mg | ORAL_TABLET | Freq: Every day | ORAL | 1 refills | Status: DC

## 2018-02-20 MED ORDER — ALLOPURINOL 100 MG PO TABS: 100.0000 mg | ORAL_TABLET | Freq: Every day | ORAL | 1 refills | Status: DC

## 2018-02-20 MED ORDER — AMLODIPINE BESYLATE-VALSARTAN 10-320 MG PO TABS: 1.0000 | ORAL_TABLET | Freq: Every day | ORAL | 3 refills | Status: DC

## 2018-02-20 NOTE — Progress Notes (Signed)
Chief Complaint  Patient presents with  . Follow-up    Patient is here today for a 44-month-F/U.  He is currently fasting.    Subjective Miguel Harrison is a 48 y.o. male who presents for hypertension follow up. He does not monitor home blood pressures. He is compliant with medication- Norvasc 10 mg/d. Patient has these side effects of medication: none He is usually adhering to a healthy diet overall. Current exercise: active at work   Past Medical History:  Diagnosis Date  . Gout   . Hypertension     Review of Systems Cardiovascular: no chest pain Respiratory:  no shortness of breath  Exam BP (!) 142/76 (BP Location: Right Arm, Cuff Size: Normal)   Pulse 98   Temp 98.3 F (36.8 C) (Oral)   Ht 5\' 10"  (1.778 m)   Wt 198 lb 9.6 oz (90.1 kg)   SpO2 98%   BMI 28.50 kg/m  General:  well developed, well nourished, in no apparent distress Heart: RRR, no bruits, no LE edema Lungs: clear to auscultation, no accessory muscle use Psych: well oriented with normal range of affect and appropriate judgment/insight  Essential hypertension - Plan: amLODipine-valsartan (EXFORGE) 10-320 MG tablet, Basic metabolic panel  Orders as above. Add ARB to amlodipine.  Counseled on diet and exercise. F/u in 1 mo, 1 wk fo labs. The patient voiced understanding and agreement to the plan.  [name], DO 02/20/18  9:05 AM

## 2018-02-20 NOTE — Patient Instructions (Addendum)
Keep the diet clean and stay active.  Consider getting a blood pressure monitor for home.   Let us know if you need anything.

## 2018-02-27 ENCOUNTER — Ambulatory Visit: Payer: Managed Care, Other (non HMO) | Admitting: Family Medicine

## 2018-03-03 ENCOUNTER — Ambulatory Visit: Payer: Managed Care, Other (non HMO)

## 2018-03-03 ENCOUNTER — Other Ambulatory Visit: Payer: Managed Care, Other (non HMO)

## 2018-03-07 ENCOUNTER — Other Ambulatory Visit (INDEPENDENT_AMBULATORY_CARE_PROVIDER_SITE_OTHER): Payer: Managed Care, Other (non HMO)

## 2018-03-07 DIAGNOSIS — R740 Nonspecific elevation of levels of transaminase and lactic acid dehydrogenase [LDH]: Secondary | ICD-10-CM

## 2018-03-07 DIAGNOSIS — D539 Nutritional anemia, unspecified: Secondary | ICD-10-CM | POA: Diagnosis not present

## 2018-03-07 DIAGNOSIS — R7401 Elevation of levels of liver transaminase levels: Secondary | ICD-10-CM

## 2018-03-07 DIAGNOSIS — I10 Essential (primary) hypertension: Secondary | ICD-10-CM | POA: Diagnosis not present

## 2018-03-07 LAB — FOLATE: Folate: 10.9 ng/mL (ref >5.9)

## 2018-03-07 LAB — IBC PANEL
Iron: 149 ug/dL (ref 42–165)
SATURATION RATIOS: 46.5 % (ref 20.0–50.0)
TRANSFERRIN: 229 mg/dL (ref 212.0–360.0)

## 2018-03-07 LAB — VITAMIN B12: VITAMIN B 12: 284 pg/mL (ref 211–911)

## 2018-03-07 LAB — BASIC METABOLIC PANEL
BUN: 17 mg/dL (ref 6–23)
CALCIUM: 9.1 mg/dL (ref 8.4–10.5)
CO2: 24 meq/L (ref 19–32)
Chloride: 100 mEq/L (ref 96–112)
Creatinine, Ser: 0.94 mg/dL (ref 0.40–1.50)
GFR: 102.91 mL/min (ref >60.00)
GLUCOSE: 72 mg/dL (ref 70–99)
Potassium: 4.2 mEq/L (ref 3.5–5.1)
SODIUM: 134 meq/L — AB (ref 135–145)

## 2018-03-07 LAB — FERRITIN: Ferritin: 485.5 ng/mL — ABNORMAL HIGH (ref 22.0–322.0)

## 2018-03-08 LAB — HEPATITIS C ANTIBODY
Hepatitis C Ab: NONREACTIVE
SIGNAL TO CUT-OFF: 0.13 (ref <1.00)

## 2018-03-08 LAB — HEPATITIS B SURFACE ANTIGEN: Hepatitis B Surface Ag: NONREACTIVE

## 2018-03-09 ENCOUNTER — Other Ambulatory Visit: Payer: Self-pay | Admitting: Family Medicine

## 2018-03-09 DIAGNOSIS — R7989 Other specified abnormal findings of blood chemistry: Secondary | ICD-10-CM

## 2018-03-10 ENCOUNTER — Ambulatory Visit: Payer: Managed Care, Other (non HMO)

## 2018-03-10 ENCOUNTER — Other Ambulatory Visit: Payer: Managed Care, Other (non HMO)

## 2018-03-13 ENCOUNTER — Encounter: Payer: Self-pay | Admitting: Family Medicine

## 2018-03-21 ENCOUNTER — Telehealth: Payer: Self-pay | Admitting: Family Medicine

## 2018-03-21 NOTE — Telephone Encounter (Signed)
Attempted to contact pt; pre-recorded message states that the pt is not accepting calls at this time.

## 2018-03-21 NOTE — Telephone Encounter (Signed)
Copied from CRM [id number]. Topic: Quick Communication - See Telephone Encounter >> Mar 21, 2018 10:47 AM Angela Nevin wrote: CRM for notification. See Telephone encounter for: 03/21/18.  Patient called stating that he would like clarity on lab results from 1/28. Patient states he does not understand result letter he received in mail. Please advise.

## 2018-03-24 ENCOUNTER — Other Ambulatory Visit: Payer: Managed Care, Other (non HMO)

## 2018-03-24 ENCOUNTER — Telehealth: Payer: Self-pay

## 2018-03-24 ENCOUNTER — Ambulatory Visit: Payer: Managed Care, Other (non HMO) | Admitting: Family Medicine

## 2018-03-24 NOTE — Telephone Encounter (Signed)
OK to waive.

## 2018-03-24 NOTE — Telephone Encounter (Signed)
Copied from CRM [id number]. Topic: Quick Communication - Appointment Cancellation >> Mar 24, 2018  1:53 PM Maia Petties wrote: Patient called to cancel appointment scheduled for today 3:45pm with Dr. Carmelia Roller. Patient has rescheduled their appointment.  pt states that his scotter battery is dead and cannot come in, hoping fee can be waived, requesting call back to notify of fee, updated tel #  Route to department's PEC pool.

## 2018-03-24 NOTE — Addendum Note (Signed)
Addended by: PRICE, KRISTY M on: 03/24/2018 09:24 AM   Modules accepted: Orders  

## 2018-03-24 NOTE — Telephone Encounter (Signed)
Returned call to pt. to discuss questions about his lab work from 03/07/18.  Reviewed the following result note with the pt.:     Notes recorded by [name], DO on 03/09/2018 at 7:18 AM EST "Let Alinda Money know most of his labs are normal. One came back high, but with the others being normal this is less concerning. I would like to recheck it, make sure we aren't sick or coming off of a rigorous workout. Please order ferritin and sched lab visit. TY."   Stated he had a cold during the time his lab work was drawn, and "didn't feel the best."  Reported he is willing to repeat the Ferritin level, but is asking about the $50.00 co-pay.  Stated "Dr. Carmelia Roller knows my situation, and I wanted to know if he can have the $50.00 fee waived?"   Advised will send note to Dr. Carmelia Roller.  Agrees with plan.    (documented in Telephone encounter since result note was not sent to NT.)

## 2018-03-24 NOTE — Telephone Encounter (Signed)
Pt called back with updated ph#. The number we had was not correct. Please call at [phone].

## 2018-03-31 ENCOUNTER — Ambulatory Visit: Payer: Managed Care, Other (non HMO) | Admitting: Family Medicine

## 2018-03-31 NOTE — Telephone Encounter (Signed)
Ok will waive fee on 03/24/2018.

## 2018-04-07 ENCOUNTER — Ambulatory Visit: Payer: Managed Care, Other (non HMO) | Admitting: Family Medicine

## 2018-04-14 ENCOUNTER — Encounter: Payer: Self-pay | Admitting: Family Medicine

## 2018-04-14 ENCOUNTER — Ambulatory Visit (INDEPENDENT_AMBULATORY_CARE_PROVIDER_SITE_OTHER): Payer: Managed Care, Other (non HMO) | Admitting: Family Medicine

## 2018-04-14 VITALS — BP 134/84 | HR 94 | Temp 98.6°F | Resp 18 | Ht 70.0 in | Wt 212.0 lb

## 2018-04-14 DIAGNOSIS — R7401 Elevation of levels of liver transaminase levels: Secondary | ICD-10-CM

## 2018-04-14 DIAGNOSIS — R7989 Other specified abnormal findings of blood chemistry: Secondary | ICD-10-CM

## 2018-04-14 DIAGNOSIS — I10 Essential (primary) hypertension: Secondary | ICD-10-CM

## 2018-04-14 DIAGNOSIS — R740 Nonspecific elevation of levels of transaminase and lactic acid dehydrogenase [LDH]: Secondary | ICD-10-CM | POA: Diagnosis not present

## 2018-04-14 NOTE — Patient Instructions (Addendum)
Cut down on beer. I think this will help many many of your issues.   Keep the diet clean and stay active.  Give [place] 2-3 business days to get the results of your labs back.   Let us know if you need anything.

## 2018-04-14 NOTE — Progress Notes (Signed)
Chief Complaint  Patient presents with  . Hypertension    Pt states he has not gotten a BP cuff to check BPs yet. Pt startes he hasn't taken BP medication today.  . Follow-up    Subjective Miguel Harrison is a 48 y.o. male who presents for hypertension follow up. He does not monitor home blood pressures. He is compliant with medications. Patient has these side effects of medication: none He is not routinely adhering to a healthy diet overall. Current exercise: active at work  Drinkns around 3 40 oz beers daily. Has been having ED issues and loose bowels/LLQ abd discomfort. Mother passed away and he had been coping with this. We had been monitoring his assoc labs.    Past Medical History:  Diagnosis Date  . Gout   . Hypertension     Review of Systems Cardiovascular: no chest pain Respiratory:  no shortness of breath  Exam BP 134/84 (BP Location: Left Arm, Patient Position: Sitting, Cuff Size: Normal)   Pulse 94   Temp 98.6 F (37 C) (Oral)   Resp 18   Ht 5\' 10"  (1.778 m)   Wt 212 lb (96.2 kg)   SpO2 98%   BMI 30.42 kg/m  General:  well developed, well nourished, in no apparent distress Heart: RRR, no bruits, no LE edema Lungs: clear to auscultation, no accessory muscle use Psych: well oriented with normal range of affect and appropriate judgment/insight  Essential hypertension  Transaminitis  Elevated ferritin - Plan: Ferritin   Cont Exforge 10-320 mg/d. Cut down on alcohol. Likely causing all issues as above. Could be flaring gout/raising uric acid.  Counseled on diet and exercise. F/u in 3 mo, will ck LFTs and BP again. If LFT's still elevated, will ck [place]. The patient voiced understanding and agreement to the plan.  [name] [place], DO 04/14/18  5:17 PM

## 2018-04-15 LAB — FERRITIN: FERRITIN: 966 ng/mL — AB (ref 38–380)

## 2018-07-14 ENCOUNTER — Ambulatory Visit: Payer: Managed Care, Other (non HMO) | Admitting: Family Medicine

## 2018-07-21 ENCOUNTER — Ambulatory Visit: Payer: Managed Care, Other (non HMO) | Admitting: Family Medicine

## 2018-07-28 ENCOUNTER — Ambulatory Visit (INDEPENDENT_AMBULATORY_CARE_PROVIDER_SITE_OTHER): Payer: Managed Care, Other (non HMO) | Admitting: Family Medicine

## 2018-07-28 ENCOUNTER — Encounter: Payer: Self-pay | Admitting: Family Medicine

## 2018-07-28 ENCOUNTER — Other Ambulatory Visit: Payer: Self-pay

## 2018-07-28 VITALS — BP 126/72 | HR 67 | Temp 98.3°F | Ht 70.0 in | Wt 209.0 lb

## 2018-07-28 DIAGNOSIS — M1A9XX0 Chronic gout, unspecified, without tophus (tophi): Secondary | ICD-10-CM

## 2018-07-28 DIAGNOSIS — I10 Essential (primary) hypertension: Secondary | ICD-10-CM

## 2018-07-28 DIAGNOSIS — Z1211 Encounter for screening for malignant neoplasm of colon: Secondary | ICD-10-CM | POA: Diagnosis not present

## 2018-07-28 NOTE — Progress Notes (Signed)
Chief Complaint  Patient presents with  . Follow-up    Subjective Miguel Harrison is a 48 y.o. male who presents for hypertension follow up. He does not routinely monitor home blood pressures. He is compliant with medications- Exforge 10-320 mg/d. Patient has these side effects of medication: none He is sometimes adhering to a healthy diet overall. Current exercise: active in yard  No flares in gout since last visit. Taking allopurinol daily, no AE's. Compliant.    Past Medical History:  Diagnosis Date  . Gout   . Hypertension     Review of Systems Cardiovascular: no chest pain Respiratory:  no shortness of breath  Exam BP 126/72 (BP Location: Left Arm, Patient Position: Sitting, Cuff Size: Large)   Pulse 67   Temp 98.3 F (36.8 C) (Oral)   Ht 5\' 10"  (1.778 m)   Wt 209 lb (94.8 kg)   SpO2 96%   BMI 29.99 kg/m  General:  well developed, well nourished, in no apparent distress Heart: RRR, no bruits, no LE edema Lungs: clear to auscultation, no accessory muscle use Psych: well oriented with normal range of affect and appropriate judgment/insight  Essential hypertension - Plan: Cont Exforge  Chronic gout involving toe without tophus, unspecified cause, unspecified laterality - Plan: Cont Allopurinol  Screen for colon cancer - Plan: Ambulatory referral to Gastroenterology  Counseled on diet and exercise. F/u in 6 mo or prn. The patient voiced understanding and agreement to the plan.  [place], DO 07/28/18  3:16 PM

## 2018-07-28 NOTE — Patient Instructions (Signed)
Keep the diet clean and stay active.  If you do not hear anything about your referral in the next 1-2 weeks, call our office and ask for an update.  Let us know if you need anything.  

## 2018-08-15 ENCOUNTER — Other Ambulatory Visit: Payer: Self-pay

## 2018-08-15 ENCOUNTER — Ambulatory Visit (AMBULATORY_SURGERY_CENTER): Payer: Managed Care, Other (non HMO) | Admitting: *Deleted

## 2018-08-15 VITALS — Ht 70.0 in | Wt 208.0 lb

## 2018-08-15 DIAGNOSIS — Z1211 Encounter for screening for malignant neoplasm of colon: Secondary | ICD-10-CM

## 2018-08-15 MED ORDER — SUPREP BOWEL PREP KIT 17.5-3.13-1.6 GM/177ML PO SOLN
1.0000 | Freq: Once | ORAL | 0 refills | Status: AC
Start: 2018-08-15 — End: 2018-08-15

## 2018-08-15 NOTE — Progress Notes (Signed)
Patient denies any allergies to egg or soy products. Patient denies complications with anesthesia/sedation.  Patient denies oxygen use at home and denies diet medications.   Pt verified name, DOB, address and insurance during PV today. Pt mailed instruction packet to included paper to complete and mail back to [facility] with addressed and stamped envelope, Emmi video, copy of consent form to read and not return, and instructions.Suprep coupon mailed in packet. PV completed over the phone. Pt encouraged to call with questions or issues. 

## 2018-08-16 ENCOUNTER — Encounter: Payer: Self-pay | Admitting: Gastroenterology

## 2018-08-24 ENCOUNTER — Telehealth: Payer: Self-pay | Admitting: Gastroenterology

## 2018-08-24 NOTE — Telephone Encounter (Signed)
No answer or Vmail setup  Covid-19 Screening Questions:  Do you now or have you had a fever in the last 14 days?   Do you have any respiratory symptoms of shortness of breath or cough now or in the last 14 days?   Do you have any family members or close contacts with diagnosed or suspected Covid-19 in the past 14 days?   Have you been tested for Covid-19 and found to be positive?   Pt made aware of that care partner may wait in the car or come up to the lobby during the procedure but will need to provide their own mask.

## 2018-08-25 ENCOUNTER — Ambulatory Visit (AMBULATORY_SURGERY_CENTER): Payer: Managed Care, Other (non HMO) | Admitting: Gastroenterology

## 2018-08-25 ENCOUNTER — Encounter: Payer: Self-pay | Admitting: Gastroenterology

## 2018-08-25 ENCOUNTER — Other Ambulatory Visit: Payer: Self-pay

## 2018-08-25 VITALS — BP 160/102 | HR 77 | Temp 98.9°F | Resp 37 | Ht 70.0 in | Wt 208.0 lb

## 2018-08-25 DIAGNOSIS — K640 First degree hemorrhoids: Secondary | ICD-10-CM

## 2018-08-25 DIAGNOSIS — Z1211 Encounter for screening for malignant neoplasm of colon: Secondary | ICD-10-CM | POA: Diagnosis present

## 2018-08-25 DIAGNOSIS — K5730 Diverticulosis of large intestine without perforation or abscess without bleeding: Secondary | ICD-10-CM

## 2018-08-25 MED ORDER — SODIUM CHLORIDE 0.9 % IV SOLN: 500.0000 mL | Freq: Once | INTRAVENOUS | Status: DC

## 2018-08-25 NOTE — Progress Notes (Signed)
Report given to PACU, vss 

## 2018-08-25 NOTE — Patient Instructions (Signed)
Handouts given for diverticulosis, hemorrhoids and high fiber diet.  YOU HAD AN ENDOSCOPIC PROCEDURE TODAY AT THE [facility]:   Refer to the procedure report that was given to you for any specific questions about what was found during the examination.  If the procedure report does not answer your questions, please call your gastroenterologist to clarify.  If you requested that your care partner not be given the details of your procedure findings, then the procedure report has been included in a sealed envelope for you to review at your convenience later.  YOU SHOULD EXPECT: Some feelings of bloating in the abdomen. Passage of more gas than usual.  Walking can help get rid of the air that was put into your GI tract during the procedure and reduce the bloating. If you had a lower endoscopy (such as a colonoscopy or flexible sigmoidoscopy) you may notice spotting of blood in your stool or on the toilet paper. If you underwent a bowel prep for your procedure, you may not have a normal bowel movement for a few days.  Please Note:  You might notice some irritation and congestion in your nose or some drainage.  This is from the oxygen used during your procedure.  There is no need for concern and it should clear up in a day or so.  SYMPTOMS TO REPORT IMMEDIATELY:   Following lower endoscopy (colonoscopy or flexible sigmoidoscopy):  Excessive amounts of blood in the stool  Significant tenderness or worsening of abdominal pains  Swelling of the abdomen that is new, acute  Fever of 100F or higher   For urgent or emergent issues, a gastroenterologist can be reached at any hour by calling [phone].   DIET:  We do recommend a small meal at first, but then you may proceed to your regular diet.  Drink plenty of fluids but you should avoid alcoholic beverages for 24 hours.  ACTIVITY:  You should plan to take it easy for the rest of today and you should NOT DRIVE or use heavy machinery  until tomorrow (because of the sedation medicines used during the test).    FOLLOW UP: Our staff will call the number listed on your records 48-72 hours following your procedure to check on you and address any questions or concerns that you may have regarding the information given to you following your procedure. If we do not reach you, we will leave a message.  We will attempt to reach you two times.  During this call, we will ask if you have developed any symptoms of COVID 19. If you develop any symptoms (ie: fever, flu-like symptoms, shortness of breath, cough etc.) before then, please call [phone].  If you test positive for Covid 19 in the 2 weeks post procedure, please call and report this information to us.    If any biopsies were taken you will be contacted by phone or by letter within the next 1-3 weeks.  Please call us at [phone] if you have not heard about the biopsies in 3 weeks.    SIGNATURES/CONFIDENTIALITY: You and/or your care partner have signed paperwork which will be entered into your electronic medical record.  These signatures attest to the fact that that the information above on your After Visit Summary has been reviewed and is understood.  Full responsibility of the confidentiality of this discharge information lies with you and/or your care-partner. 

## 2018-08-25 NOTE — Op Note (Signed)
[place] Patient Name: Miguel Harrison Procedure Date: 08/25/2018 1:20 PM MRN: [id number] Endoscopist: Gerrit Heck , MD Age: 48 Referring MD:  Date of Birth: March 27, 1970 Gender: Male Account #: [id number] Procedure:                Colonoscopy Indications:              Screening for colorectal malignant neoplasm, This                            is the patient's first colonoscopy Medicines:                Monitored Anesthesia Care Procedure:                Pre-Anesthesia Assessment:                           - Prior to the procedure, a History and Physical                            was performed, and patient medications and                            allergies were reviewed. The patient's tolerance of                            previous anesthesia was also reviewed. The risks                            and benefits of the procedure and the sedation                            options and risks were discussed with the patient.                            All questions were answered, and informed consent                            was obtained. Prior Anticoagulants: The patient has                            taken no previous anticoagulant or antiplatelet                            agents. ASA Grade Assessment: II - A patient with                            mild systemic disease. After reviewing the risks                            and benefits, the patient was deemed in                            satisfactory condition to undergo the procedure.  After obtaining informed consent, the colonoscope                            was passed under direct vision. Throughout the                            procedure, the patient's blood pressure, pulse, and                            oxygen saturations were monitored continuously. The                            Colonoscope was introduced through the anus and                            advanced to the the  cecum, identified by                            appendiceal orifice and ileocecal valve. The                            colonoscopy was performed without difficulty. The                            patient tolerated the procedure well. The quality                            of the bowel preparation was adequate. The                            ileocecal valve, appendiceal orifice, and rectum                            were photographed. Scope In: 1:29:25 PM Scope Out: 1:44:06 PM Scope Withdrawal Time: 0 hours 13 minutes 14 seconds  Total Procedure Duration: 0 hours 14 minutes 41 seconds  Findings:                 The perianal and digital rectal examinations were                            normal.                           Multiple small and large-mouthed diverticula were                            found in the sigmoid colon, descending colon,                            transverse colon and ascending colon.                           Non-bleeding internal hemorrhoids were found during  anoscopy. The hemorrhoids were small.                           Retroflexion in the rectum was not performed due to                            anatomy/narrowed rectal vault. Anterograde views                            were otherwise normal in the rectum. Complications:            No immediate complications. Estimated Blood Loss:     Estimated blood loss: none. Impression:               - Diverticulosis in the sigmoid colon, in the                            descending colon, in the transverse colon and in                            the ascending colon.                           - Non-bleeding internal hemorrhoids.                           - No specimens collected. Recommendation:           - Patient has a contact number available for                            emergencies. The signs and symptoms of potential                            delayed complications were discussed with the                             patient. Return to normal activities tomorrow.                            Written discharge instructions were provided to the                            patient.                           - Resume previous diet.                           - Continue present medications.                           - Repeat colonoscopy in 10 years for screening                            purposes.                           -  Return to GI office PRN.                           - Use fiber, for example Citrucel, Fibercon, Konsyl                            or Metamucil.                           - Internal hemorrhoids were noted on this study and                            may be amenable to hemorrhoid band ligation. If you                            are interested in further treatment of these                            hemorrhoids with band ligation, please contact my                            clinic to set up an appointment for evaluation and                            treatment. Miguel LocksVito Tiant [place], MD 08/25/2018 1:49:15 PM

## 2018-08-25 NOTE — Progress Notes (Signed)
Pt's states no medical or surgical changes since previsit or office visit.  Temp-courtney washington  Vital signs-judy branson 

## 2018-08-29 ENCOUNTER — Telehealth: Payer: Self-pay | Admitting: *Deleted

## 2018-08-29 NOTE — Telephone Encounter (Signed)
  Follow up Call-  Call back number 08/25/2018  Post procedure Call Back phone  # [phone] wife # ok to talk with her  Permission to leave phone message Yes  Some recent data might be hidden     Patient questions:  Do you have a fever, pain , or abdominal swelling? No. Pain Score  0 *  Have you tolerated food without any problems? Yes.    Have you been able to return to your normal activities? Yes.    Do you have any questions about your discharge instructions: Diet   No. Medications  No. Follow up visit  No.  Do you have questions or concerns about your Care? No.  Actions: * If pain score is 4 or above: No action needed, pain <4.  1. Have you developed a fever since your procedure? NO  2.   Have you had an respiratory symptoms (SOB or cough) since your procedure? NO  3.   Have you tested positive for COVID 19 since your procedure NO  4.   Have you had any family members/close contacts diagnosed with the COVID 19 since your procedure?  NO   If yes to any of these questions please route to Joylene John, RN and Alphonsa Gin, RN.

## 2018-09-11 ENCOUNTER — Other Ambulatory Visit: Payer: Self-pay

## 2018-09-12 ENCOUNTER — Encounter: Payer: Self-pay | Admitting: Family Medicine

## 2018-09-12 ENCOUNTER — Ambulatory Visit (INDEPENDENT_AMBULATORY_CARE_PROVIDER_SITE_OTHER): Payer: Managed Care, Other (non HMO) | Admitting: Family Medicine

## 2018-09-12 DIAGNOSIS — K529 Noninfective gastroenteritis and colitis, unspecified: Secondary | ICD-10-CM

## 2018-09-12 DIAGNOSIS — R05 Cough: Secondary | ICD-10-CM | POA: Diagnosis not present

## 2018-09-12 DIAGNOSIS — I10 Essential (primary) hypertension: Secondary | ICD-10-CM | POA: Diagnosis not present

## 2018-09-12 DIAGNOSIS — R059 Cough, unspecified: Secondary | ICD-10-CM

## 2018-09-12 MED ORDER — PANTOPRAZOLE SODIUM 40 MG PO TBEC: 40.0000 mg | DELAYED_RELEASE_TABLET | Freq: Every day | ORAL | 1 refills | Status: DC

## 2018-09-12 MED ORDER — AMLODIPINE BESYLATE-VALSARTAN 10-320 MG PO TABS: 1.0000 | ORAL_TABLET | Freq: Every day | ORAL | 2 refills | Status: DC

## 2018-09-12 MED ORDER — ALLOPURINOL 100 MG PO TABS: 100.0000 mg | ORAL_TABLET | Freq: Every day | ORAL | 2 refills | Status: DC

## 2018-09-12 NOTE — Progress Notes (Signed)
Chief Complaint  Patient presents with  . Emesis  . Weight Loss    Subjective: Patient is a 48 y.o. male here for vomiting in AM. Due to COVID-19 pandemic, we are interacting via web portal for an electronic face-to-face visit. I verified patient's ID using 2 identifiers. Patient agreed to proceed with visit via this method. Patient is at home, I am at office. Patient and I are present for visit.   Over past 2 weeks, pt has been having LLQ abd pain, loose stools, N/V after first meal of day, and some cough. No sick contacts. Denies myalgias, fevers, URI s/s's otherwise. He feels he is losing weight. No associated with specific foods. Trying to stay hydrated. No hx of reflux. No new foods or meds.   ROS: Const: no fevers GI: As noted in HPI  Past Medical History:  Diagnosis Date  . Gout   . Hypertension   . Sickle cell trait ([facility])     Objective: No conversational dyspnea Age appropriate judgment and insight Nml affect and mood  Assessment and Plan: Cough - Plan:  Novel Coronavirus, NAA ([facility])  Gastroenteritis - Plan: Novel Coronavirus, NAA ([facility]), trial PPI for symptomatic control. Push fluids w electrolytes.   F/u prn. The patient voiced understanding and agreement to the plan.  [place], DO 09/12/18  12:05 PM

## 2018-10-30 ENCOUNTER — Telehealth: Payer: Self-pay | Admitting: Family Medicine

## 2018-10-30 NOTE — Telephone Encounter (Signed)
Copied from [place] [id number]. Topic: General - Other >> Oct 30, 2018  9:47 AM Yvette Rack wrote: Reason for CRM: Pt stated he needs to speak with Dr. Nani Ravens about the gas and reflux medication. Pt stated his body just does not feel right and he needs Dr. Nani Ravens to call him back.  Called the patient to question what problems he is having///no answer/no voicemail set up.

## 2018-10-31 MED ORDER — PANTOPRAZOLE SODIUM 40 MG PO TBEC: 40.0000 mg | DELAYED_RELEASE_TABLET | Freq: Every day | ORAL | 1 refills | Status: DC

## 2018-10-31 NOTE — Telephone Encounter (Signed)
Called and spoke to the patient and he is needing a 90 day supply for pantoprazole

## 2018-10-31 NOTE — Telephone Encounter (Signed)
Pt is returning Robin's call.  Please assist.

## 2018-10-31 NOTE — Telephone Encounter (Signed)
Called the patient no answer/voice mail not set up yet. 

## 2018-10-31 NOTE — Telephone Encounter (Signed)
Called the patient back /// again no answer// voice mail not set up yet

## 2018-11-01 ENCOUNTER — Ambulatory Visit (INDEPENDENT_AMBULATORY_CARE_PROVIDER_SITE_OTHER): Payer: Managed Care, Other (non HMO) | Admitting: Family Medicine

## 2018-11-01 ENCOUNTER — Encounter: Payer: Self-pay | Admitting: Family Medicine

## 2018-11-01 ENCOUNTER — Other Ambulatory Visit: Payer: Self-pay

## 2018-11-01 DIAGNOSIS — L219 Seborrheic dermatitis, unspecified: Secondary | ICD-10-CM | POA: Diagnosis not present

## 2018-11-01 DIAGNOSIS — M545 Low back pain, unspecified: Secondary | ICD-10-CM

## 2018-11-01 DIAGNOSIS — R630 Anorexia: Secondary | ICD-10-CM | POA: Diagnosis not present

## 2018-11-01 NOTE — Progress Notes (Signed)
CC: Gas  GERD Miguel Harrison is a 48 y.o. male who presents for evaluation of gas. Due to COVID-19 pandemic, we are interacting via web portal for an electronic face-to-face visit. I verified patient's ID using 2 identifiers. Patient agreed to proceed with visit via this method. Patient is at home, I am at office. Patient and I are present for visit.   Patient is having continued gas and decreased appetite.  Fortunately, he has not started the pantoprazole.  It was recently called and again due to insurance reasons and he plans to start soon.  He is also having dry skin on his face over the past month or so.  No new topicals, soaps, lotions or detergents.  It neither hurts nor itches.  There is nothing draining from the area.  He has not tried anything other than Vaseline so far.  The patient has a history of low back pain over the past 1-2 months.  He lifts lots of things at work.  There was not a specific injury but he believes it is a culmination of activity at his job.  Past Medical History:  Diagnosis Date  . Gout   . Hypertension   . Sickle cell trait ([place])     Review of Systems Gastrointestinal: as noted in HPI  Exam No conversational dyspnea Age appropriate judgment and insight Nml affect and mood There are some reticular scales on his face without erythema or pigment changes  Assessment and Plan  Decreased appetite  Seborrheic dermatitis  Bilateral low back pain, unspecified chronicity, unspecified whether sciatica present  Will see how he does on PPI. OTC HC cream 1% twice daily for 7-10 days. Cont w moisturizer. Stretches/exercises, heat, ice, Tylenol.  F/u in 1 mo to reck.  Would refer to occupational team if no improvement. Pt voiced understanding and agreement to the plan.  [place], DO 11/01/18  4:53 PM

## 2018-11-01 NOTE — Patient Instructions (Signed)
1% over-the-counter hydrocortisone cream on the face twice daily for 7-10 days.  Continue using Vaseline for moisturizing.  Could also consider a facial moisturizer.  Heat (pad or rice pillow in microwave) over affected area, 10-15 minutes twice daily.   Ice/cold pack over area for 10-15 min twice daily.  OK to take Tylenol 1000 mg (2 extra strength tabs) or 975 mg (3 regular strength tabs) every 6 hours as needed.  Ibuprofen 400-600 mg (2-3 over the counter strength tabs) every 6 hours as needed for pain.  EXERCISES  RANGE OF MOTION (ROM) AND STRETCHING EXERCISES - Low Back Pain Most people with lower back pain will find that their symptoms get worse with excessive bending forward (flexion) or arching at the lower back (extension). The exercises that will help resolve your symptoms will focus on the opposite motion.  If you have pain, numbness or tingling which travels down into your buttocks, leg or foot, the goal of the therapy is for these symptoms to move closer to your back and eventually resolve. Sometimes, these leg symptoms will get better, but your lower back pain may worsen. This is often an indication of progress in your rehabilitation. Be very alert to any changes in your symptoms and the activities in which you participated in the 24 hours prior to the change. Sharing this information with your caregiver will allow him or her to most efficiently treat your condition. These exercises may help you when beginning to rehabilitate your injury. Your symptoms may resolve with or without further involvement from your physician, physical therapist or athletic trainer. While completing these exercises, remember:   Restoring tissue flexibility helps normal motion to return to the joints. This allows healthier, less painful movement and activity.  An effective stretch should be held for at least 30 seconds.  A stretch should never be painful. You should only feel a gentle lengthening or release  in the stretched tissue. FLEXION RANGE OF MOTION AND STRETCHING EXERCISES:  STRETCH - Flexion, Single Knee to Chest   Lie on a firm bed or floor with both legs extended in front of you.  Keeping one leg in contact with the floor, bring your opposite knee to your chest. Hold your leg in place by either grabbing behind your thigh or at your knee.  Pull until you feel a gentle stretch in your low back. Hold 30 seconds.  Slowly release your grasp and repeat the exercise with the opposite side. Repeat 2 times. Complete this exercise 3 times per week.   STRETCH - Flexion, Double Knee to Chest  Lie on a firm bed or floor with both legs extended in front of you.  Keeping one leg in contact with the floor, bring your opposite knee to your chest.  Tense your stomach muscles to support your back and then lift your other knee to your chest. Hold your legs in place by either grabbing behind your thighs or at your knees.  Pull both knees toward your chest until you feel a gentle stretch in your low back. Hold 30 seconds.  Tense your stomach muscles and slowly return one leg at a time to the floor. Repeat 2 times. Complete this exercise 3 times per week.   STRETCH - Low Trunk Rotation  Lie on a firm bed or floor. Keeping your legs in front of you, bend your knees so they are both pointed toward the ceiling and your feet are flat on the floor.  Extend your arms out to the  side. This will stabilize your upper body by keeping your shoulders in contact with the floor.  Gently and slowly drop both knees together to one side until you feel a gentle stretch in your low back. Hold for 30 seconds.  Tense your stomach muscles to support your lower back as you bring your knees back to the starting position. Repeat the exercise to the other side. Repeat 2 times. Complete this exercise at least 3 times per week.   EXTENSION RANGE OF MOTION AND FLEXIBILITY EXERCISES:  STRETCH - Extension, Prone on Elbows    Lie on your stomach on the floor, a bed will be too soft. Place your palms about shoulder width apart and at the height of your head.  Place your elbows under your shoulders. If this is too painful, stack pillows under your chest.  Allow your body to relax so that your hips drop lower and make contact more completely with the floor.  Hold this position for 30 seconds.  Slowly return to lying flat on the floor. Repeat 2 times. Complete this exercise 3 times per week.   RANGE OF MOTION - Extension, Prone Press Ups  Lie on your stomach on the floor, a bed will be too soft. Place your palms about shoulder width apart and at the height of your head.  Keeping your back as relaxed as possible, slowly straighten your elbows while keeping your hips on the floor. You may adjust the placement of your hands to maximize your comfort. As you gain motion, your hands will come more underneath your shoulders.  Hold this position 30 seconds.  Slowly return to lying flat on the floor. Repeat 2 times. Complete this exercise 3 times per week.   RANGE OF MOTION- Quadruped, Neutral Spine   Assume a hands and knees position on a firm surface. Keep your hands under your shoulders and your knees under your hips. You may place padding under your knees for comfort.  Drop your head and point your tailbone toward the ground below you. This will round out your lower back like an angry cat. Hold this position for 30 seconds.  Slowly lift your head and release your tail bone so that your back sags into a large arch, like an old horse.  Hold this position for 30 seconds.  Repeat this until you feel limber in your low back.  Now, find your "sweet spot." This will be the most comfortable position somewhere between the two previous positions. This is your neutral spine. Once you have found this position, tense your stomach muscles to support your low back.  Hold this position for 30 seconds. Repeat 2 times.  Complete this exercise 3 times per week.   STRENGTHENING EXERCISES - Low Back Sprain These exercises may help you when beginning to rehabilitate your injury. These exercises should be done near your "sweet spot." This is the neutral, low-back arch, somewhere between fully rounded and fully arched, that is your least painful position. When performed in this safe range of motion, these exercises can be used for people who have either a flexion or extension based injury. These exercises may resolve your symptoms with or without further involvement from your physician, physical therapist or athletic trainer. While completing these exercises, remember:   Muscles can gain both the endurance and the strength needed for everyday activities through controlled exercises.  Complete these exercises as instructed by your physician, physical therapist or athletic trainer. Increase the resistance and repetitions only as guided.  You may experience muscle soreness or fatigue, but the pain or discomfort you are trying to eliminate should never worsen during these exercises. If this pain does worsen, stop and make certain you are following the directions exactly. If the pain is still present after adjustments, discontinue the exercise until you can discuss the trouble with your caregiver.  STRENGTHENING - Deep Abdominals, Pelvic Tilt   Lie on a firm bed or floor. Keeping your legs in front of you, bend your knees so they are both pointed toward the ceiling and your feet are flat on the floor.  Tense your lower abdominal muscles to press your low back into the floor. This motion will rotate your pelvis so that your tail bone is scooping upwards rather than pointing at your feet or into the floor. With a gentle tension and even breathing, hold this position for 3 seconds. Repeat 2 times. Complete this exercise 3 times per week.   STRENGTHENING - Abdominals, Crunches   Lie on a firm bed or floor. Keeping your legs  in front of you, bend your knees so they are both pointed toward the ceiling and your feet are flat on the floor. Cross your arms over your chest.  Slightly tip your chin down without bending your neck.  Tense your abdominals and slowly lift your trunk high enough to just clear your shoulder blades. Lifting higher can put excessive stress on the lower back and does not further strengthen your abdominal muscles.  Control your return to the starting position. Repeat 2 times. Complete this exercise 3 times per week.   STRENGTHENING - Quadruped, Opposite UE/LE Lift   Assume a hands and knees position on a firm surface. Keep your hands under your shoulders and your knees under your hips. You may place padding under your knees for comfort.  Find your neutral spine and gently tense your abdominal muscles so that you can maintain this position. Your shoulders and hips should form a rectangle that is parallel with the floor and is not twisted.  Keeping your trunk steady, lift your right hand no higher than your shoulder and then your left leg no higher than your hip. Make sure you are not holding your breath. Hold this position for 30 seconds.  Continuing to keep your abdominal muscles tense and your back steady, slowly return to your starting position. Repeat with the opposite arm and leg. Repeat 2 times. Complete this exercise 3 times per week.   STRENGTHENING - Abdominals and Quadriceps, Straight Leg Raise   Lie on a firm bed or floor with both legs extended in front of you.  Keeping one leg in contact with the floor, bend the other knee so that your foot can rest flat on the floor.  Find your neutral spine, and tense your abdominal muscles to maintain your spinal position throughout the exercise.  Slowly lift your straight leg off the floor about 6 inches for a count of 3, making sure to not hold your breath.  Still keeping your neutral spine, slowly lower your leg all the way to the floor.  Repeat this exercise with each leg 2 times. Complete this exercise 3 times per week.  POSTURE AND BODY MECHANICS CONSIDERATIONS - Low Back Sprain Keeping correct posture when sitting, standing or completing your activities will reduce the stress put on different body tissues, allowing injured tissues a chance to heal and limiting painful experiences. The following are general guidelines for improved posture.  While reading these guidelines, remember:  The exercises prescribed by your provider will help you have the flexibility and strength to maintain correct postures.  The correct posture provides the best environment for your joints to work. All of your joints have less wear and tear when properly supported by a spine with good posture. This means you will experience a healthier, less painful body.  Correct posture must be practiced with all of your activities, especially prolonged sitting and standing. Correct posture is as important when doing repetitive low-stress activities (typing) as it is when doing a single heavy-load activity (lifting).  RESTING POSITIONS Consider which positions are most painful for you when choosing a resting position. If you have pain with flexion-based activities (sitting, bending, stooping, squatting), choose a position that allows you to rest in a less flexed posture. You would want to avoid curling into a fetal position on your side. If your pain worsens with extension-based activities (prolonged standing, working overhead), avoid resting in an extended position such as sleeping on your stomach. Most people will find more comfort when they rest with their spine in a more neutral position, neither too rounded nor too arched. Lying on a non-sagging bed on your side with a pillow between your knees, or on your back with a pillow under your knees will often provide some relief. Keep in mind, being in any one position for a prolonged period of time, no matter how correct  your posture, can still lead to stiffness.  PROPER SITTING POSTURE In order to minimize stress and discomfort on your spine, you must sit with correct posture. Sitting with good posture should be effortless for a healthy body. Returning to good posture is a gradual process. Many people can work toward this most comfortably by using various supports until they have the flexibility and strength to maintain this posture on their own. When sitting with proper posture, your ears will fall over your shoulders and your shoulders will fall over your hips. You should use the back of the chair to support your upper back. Your lower back will be in a neutral position, just slightly arched. You may place a small pillow or folded towel at the base of your lower back for  support.  When working at a desk, create an environment that supports good, upright posture. Without extra support, muscles tire, which leads to excessive strain on joints and other tissues. Keep these recommendations in mind:  CHAIR:  A chair should be able to slide under your desk when your back makes contact with the back of the chair. This allows you to work closely.  The chair's height should allow your eyes to be level with the upper part of your monitor and your hands to be slightly lower than your elbows.  BODY POSITION  Your feet should make contact with the floor. If this is not possible, use a foot rest.  Keep your ears over your shoulders. This will reduce stress on your neck and low back.  INCORRECT SITTING POSTURES  If you are feeling tired and unable to assume a healthy sitting posture, do not slouch or slump. This puts excessive strain on your back tissues, causing more damage and pain. Healthier options include:  Using more support, like a lumbar pillow.  Switching tasks to something that requires you to be upright or walking.  Talking a brief walk.  Lying down to rest in a neutral-spine position.  PROLONGED  STANDING WHILE SLIGHTLY LEANING FORWARD  When completing a task that requires you to  lean forward while standing in one place for a long time, place either foot up on a stationary 2-4 inch high object to help maintain the best posture. When both feet are on the ground, the lower back tends to lose its slight inward curve. If this curve flattens (or becomes too large), then the back and your other joints will experience too much stress, tire more quickly, and can cause pain.  CORRECT STANDING POSTURES Proper standing posture should be assumed with all daily activities, even if they only take a few moments, like when brushing your teeth. As in sitting, your ears should fall over your shoulders and your shoulders should fall over your hips. You should keep a slight tension in your abdominal muscles to brace your spine. Your tailbone should point down to the ground, not behind your body, resulting in an over-extended swayback posture.   INCORRECT STANDING POSTURES  Common incorrect standing postures include a forward head, locked knees and/or an excessive swayback. WALKING Walk with an upright posture. Your ears, shoulders and hips should all line-up.  PROLONGED ACTIVITY IN A FLEXED POSITION When completing a task that requires you to bend forward at your waist or lean over a low surface, try to find a way to stabilize 3 out of 4 of your limbs. You can place a hand or elbow on your thigh or rest a knee on the surface you are reaching across. This will provide you more stability, so that your muscles do not tire as quickly. By keeping your knees relaxed, or slightly bent, you will also reduce stress across your lower back. CORRECT LIFTING TECHNIQUES  DO :  Assume a wide stance. This will provide you more stability and the opportunity to get as close as possible to the object which you are lifting.  Tense your abdominals to brace your spine. Bend at the knees and hips. Keeping your back locked in a  neutral-spine position, lift using your leg muscles. Lift with your legs, keeping your back straight.  Test the weight of unknown objects before attempting to lift them.  Try to keep your elbows locked down at your sides in order get the best strength from your shoulders when carrying an object.     Always ask for help when lifting heavy or awkward objects. INCORRECT LIFTING TECHNIQUES DO NOT:   Lock your knees when lifting, even if it is a small object.  Bend and twist. Pivot at your feet or move your feet when needing to change directions.  Assume that you can safely pick up even a paperclip without proper posture.

## 2018-11-01 NOTE — Telephone Encounter (Signed)
Called the patient back this morning as he stated last phone call he wanted to schedule an appt with PCP. No answer/no voice mail set up

## 2018-11-09 ENCOUNTER — Encounter (HOSPITAL_BASED_OUTPATIENT_CLINIC_OR_DEPARTMENT_OTHER): Payer: Self-pay | Admitting: *Deleted

## 2018-11-09 ENCOUNTER — Emergency Department (HOSPITAL_BASED_OUTPATIENT_CLINIC_OR_DEPARTMENT_OTHER): Payer: Managed Care, Other (non HMO)

## 2018-11-09 ENCOUNTER — Other Ambulatory Visit: Payer: Self-pay

## 2018-11-09 ENCOUNTER — Observation Stay (HOSPITAL_BASED_OUTPATIENT_CLINIC_OR_DEPARTMENT_OTHER)
Admission: EM | Admit: 2018-11-09 | Discharge: 2018-11-11 | Disposition: A | Discharge status: Home / Self Care | Payer: Managed Care, Other (non HMO) | Attending: Internal Medicine | Admitting: Internal Medicine

## 2018-11-09 DIAGNOSIS — F1010 Alcohol abuse, uncomplicated: Secondary | ICD-10-CM | POA: Diagnosis present

## 2018-11-09 DIAGNOSIS — I10 Essential (primary) hypertension: Secondary | ICD-10-CM | POA: Diagnosis not present

## 2018-11-09 DIAGNOSIS — D573 Sickle-cell trait: Secondary | ICD-10-CM | POA: Diagnosis not present

## 2018-11-09 DIAGNOSIS — R112 Nausea with vomiting, unspecified: Secondary | ICD-10-CM | POA: Diagnosis not present

## 2018-11-09 DIAGNOSIS — D649 Anemia, unspecified: Secondary | ICD-10-CM | POA: Diagnosis not present

## 2018-11-09 DIAGNOSIS — Z716 Tobacco abuse counseling: Secondary | ICD-10-CM | POA: Diagnosis not present

## 2018-11-09 DIAGNOSIS — K219 Gastro-esophageal reflux disease without esophagitis: Secondary | ICD-10-CM | POA: Insufficient documentation

## 2018-11-09 DIAGNOSIS — E871 Hypo-osmolality and hyponatremia: Secondary | ICD-10-CM | POA: Diagnosis present

## 2018-11-09 DIAGNOSIS — F17290 Nicotine dependence, other tobacco product, uncomplicated: Secondary | ICD-10-CM | POA: Insufficient documentation

## 2018-11-09 DIAGNOSIS — R1013 Epigastric pain: Secondary | ICD-10-CM

## 2018-11-09 DIAGNOSIS — E860 Dehydration: Secondary | ICD-10-CM | POA: Insufficient documentation

## 2018-11-09 DIAGNOSIS — Z8249 Family history of ischemic heart disease and other diseases of the circulatory system: Secondary | ICD-10-CM | POA: Diagnosis not present

## 2018-11-09 DIAGNOSIS — R7989 Other specified abnormal findings of blood chemistry: Secondary | ICD-10-CM | POA: Diagnosis present

## 2018-11-09 DIAGNOSIS — M1A9XX0 Chronic gout, unspecified, without tophus (tophi): Secondary | ICD-10-CM | POA: Insufficient documentation

## 2018-11-09 DIAGNOSIS — Z7141 Alcohol abuse counseling and surveillance of alcoholic: Secondary | ICD-10-CM | POA: Diagnosis not present

## 2018-11-09 DIAGNOSIS — Z20828 Contact with and (suspected) exposure to other viral communicable diseases: Secondary | ICD-10-CM | POA: Insufficient documentation

## 2018-11-09 DIAGNOSIS — Z79899 Other long term (current) drug therapy: Secondary | ICD-10-CM | POA: Insufficient documentation

## 2018-11-09 DIAGNOSIS — Z720 Tobacco use: Secondary | ICD-10-CM | POA: Diagnosis present

## 2018-11-09 DIAGNOSIS — R197 Diarrhea, unspecified: Principal | ICD-10-CM | POA: Insufficient documentation

## 2018-11-09 DIAGNOSIS — K828 Other specified diseases of gallbladder: Secondary | ICD-10-CM

## 2018-11-09 LAB — URINALYSIS, ROUTINE W REFLEX MICROSCOPIC
Bilirubin Urine: NEGATIVE
Glucose, UA: NEGATIVE mg/dL
Hgb urine dipstick: NEGATIVE
Ketones, ur: NEGATIVE mg/dL
Leukocytes,Ua: NEGATIVE
Nitrite: NEGATIVE
Protein, ur: NEGATIVE mg/dL
Specific Gravity, Urine: <1.005 — ABNORMAL LOW (ref 1.005–1.030)
pH: 6 (ref 5.0–8.0)

## 2018-11-09 LAB — CBC WITH DIFFERENTIAL/PLATELET
Abs Immature Granulocytes: 0.04 10*3/uL (ref 0.00–0.07)
Basophils Absolute: 0.1 10*3/uL (ref 0.0–0.1)
Basophils Relative: 1 %
Eosinophils Absolute: 0.1 10*3/uL (ref 0.0–0.5)
Eosinophils Relative: 1 %
HCT: 26 % — ABNORMAL LOW (ref 39.0–52.0)
Hemoglobin: 9.5 g/dL — ABNORMAL LOW (ref 13.0–17.0)
Immature Granulocytes: 1 %
Lymphocytes Relative: 25 %
Lymphs Abs: 1.2 10*3/uL (ref 0.7–4.0)
MCH: 35.4 pg — ABNORMAL HIGH (ref 26.0–34.0)
MCHC: 36.5 g/dL — ABNORMAL HIGH (ref 30.0–36.0)
MCV: 97 fL (ref 80.0–100.0)
Monocytes Absolute: 0.8 10*3/uL (ref 0.1–1.0)
Monocytes Relative: 18 %
Neutro Abs: 2.6 10*3/uL (ref 1.7–7.7)
Neutrophils Relative %: 54 %
Platelets: 124 10*3/uL — ABNORMAL LOW (ref 150–400)
RBC: 2.68 MIL/uL — ABNORMAL LOW (ref 4.22–5.81)
RDW: 12.4 % (ref 11.5–15.5)
WBC: 4.8 10*3/uL (ref 4.0–10.5)
nRBC: 0 % (ref 0.0–0.2)

## 2018-11-09 LAB — COMPREHENSIVE METABOLIC PANEL
ALT: 146 U/L — ABNORMAL HIGH (ref 0–44)
AST: 245 U/L — ABNORMAL HIGH (ref 15–41)
Albumin: 3.8 g/dL (ref 3.5–5.0)
Alkaline Phosphatase: 108 U/L (ref 38–126)
Anion gap: 15 (ref 5–15)
BUN: 5 mg/dL — ABNORMAL LOW (ref 6–20)
CO2: 21 mmol/L — ABNORMAL LOW (ref 22–32)
Calcium: 9.1 mg/dL (ref 8.9–10.3)
Chloride: 86 mmol/L — ABNORMAL LOW (ref 98–111)
Creatinine, Ser: 0.77 mg/dL (ref 0.61–1.24)
GFR calc Af Amer: >60 mL/min (ref >60)
GFR calc non Af Amer: >60 mL/min (ref >60)
Glucose, Bld: 104 mg/dL — ABNORMAL HIGH (ref 70–99)
Potassium: 4.2 mmol/L (ref 3.5–5.1)
Sodium: 122 mmol/L — ABNORMAL LOW (ref 135–145)
Total Bilirubin: 2 mg/dL — ABNORMAL HIGH (ref 0.3–1.2)
Total Protein: 7.9 g/dL (ref 6.5–8.1)

## 2018-11-09 LAB — LIPASE, BLOOD: Lipase: 65 U/L — ABNORMAL HIGH (ref 11–51)

## 2018-11-09 MED ORDER — SODIUM CHLORIDE 0.9 % IV BOLUS
1000.0000 mL | Freq: Once | INTRAVENOUS | Status: AC
  Administered 2018-11-09: 1000 mL via INTRAVENOUS

## 2018-11-09 NOTE — ED Notes (Signed)
ED TO INPATIENT HANDOFF REPORT  ED Nurse Name and Phone #: Claiborne Billings [phone]  S Name/Age/Gender Miguel Harrison 48 y.o. male Room/Bed: MH10/MH10  Code Status   Code Status: Not on file  Home/SNF/Other Home Patient oriented to: self, place, time and situation Is this baseline? Yes   Triage Complete: Triage complete  Chief Complaint VOMITING  Triage Note Pt is here due to nausea and vomiting in the am (about 5/7 mornings a week) for several weeks.  Pt has has spoken with MD about this and was prescribed an antacid.  Pt is here for further evaluation.  Pt denies any abdominal pain or other symtoms and states that the am that he does have this issue he feels fine after he vomits.    Allergies No Known Allergies  Level of Care/Admitting Diagnosis ED Disposition    ED Disposition Condition Comment   Admit  Hospital Area: [place] [[id number]]  Level of Care: Med-Surg [16]  Covid Evaluation: Asymptomatic Screening Protocol (No Symptoms)  Diagnosis: Hyponatremia [413244]  Admitting Physician: Lenore Cordia [[id number]]  Attending Physician: Lenore Cordia [[id number]]  PT Class (Do Not Modify): Observation [104]  PT Acc Code (Do Not Modify): Observation [10022]       B Medical/Surgery History Past Medical History:  Diagnosis Date  . Gout   . Hypertension   . Sickle cell trait [facility])    Past Surgical History:  Procedure Laterality Date  . NECK SURGERY       A IV Location/Drains/Wounds Patient Lines/Drains/Airways Status   Active Line/Drains/Airways    Name:   Placement date:   Placement time:   Site:   Days:   Peripheral IV 11/09/18 Right Antecubital   11/09/18    1845    Antecubital   less than 1          Intake/Output Last 24 hours No intake or output data in the 24 hours ending 11/09/18 2148  Labs/Imaging Results for orders placed or performed during the hospital encounter of 11/09/18 (from the past 48 hour(s))  Comprehensive metabolic  panel     Status: Abnormal   Collection Time: 11/09/18  6:47 PM  Result Value Ref Range   Sodium 122 (L) 135 - 145 mmol/L   Potassium 4.2 3.5 - 5.1 mmol/L   Chloride 86 (L) 98 - 111 mmol/L   CO2 21 (L) 22 - 32 mmol/L   Glucose, Bld 104 (H) 70 - 99 mg/dL   BUN 5 (L) 6 - 20 mg/dL   Creatinine, Ser 0.77 0.61 - 1.24 mg/dL   Calcium 9.1 8.9 - 10.3 mg/dL   Total Protein 7.9 6.5 - 8.1 g/dL   Albumin 3.8 3.5 - 5.0 g/dL   AST 245 (H) 15 - 41 U/L   ALT 146 (H) 0 - 44 U/L   Alkaline Phosphatase 108 38 - 126 U/L   Total Bilirubin 2.0 (H) 0.3 - 1.2 mg/dL   GFR calc non Af Amer >60 >60 mL/min   GFR calc Af Amer >60 >60 mL/min   Anion gap 15 5 - 15    Comment: Performed at [facility] [address] [place], [place] [address]  Lipase, blood     Status: Abnormal   Collection Time: 11/09/18  6:47 PM  Result Value Ref Range   Lipase 65 (H) 11 - 51 U/L    Comment: Performed at [facility], [address] [place], [place] [address]  CBC with Differential  Status: Abnormal   Collection Time: 11/09/18  6:47 PM  Result Value Ref Range   WBC 4.8 4.0 - 10.5 K/uL   RBC 2.68 (L) 4.22 - 5.81 MIL/uL   Hemoglobin 9.5 (L) 13.0 - 17.0 g/dL   HCT 80.0 (L) 34.9 - 17.9 %   MCV 97.0 80.0 - 100.0 fL   MCH 35.4 (H) 26.0 - 34.0 pg   MCHC 36.5 (H) 30.0 - 36.0 g/dL   RDW 15.0 56.9 - 79.4 %   Platelets 124 (L) 150 - 400 K/uL   nRBC 0.0 0.0 - 0.2 %   Neutrophils Relative % 54 %   Neutro Abs 2.6 1.7 - 7.7 K/uL   Lymphocytes Relative 25 %   Lymphs Abs 1.2 0.7 - 4.0 K/uL   Monocytes Relative 18 %   Monocytes Absolute 0.8 0.1 - 1.0 K/uL   Eosinophils Relative 1 %   Eosinophils Absolute 0.1 0.0 - 0.5 K/uL   Basophils Relative 1 %   Basophils Absolute 0.1 0.0 - 0.1 K/uL   Immature Granulocytes 1 %   Abs Immature Granulocytes 0.04 0.00 - 0.07 K/uL    Comment: Performed at [facility], [address] [place], [place] [address]  Urinalysis, Routine w reflex microscopic      Status: Abnormal   Collection Time: 11/09/18  6:47 PM  Result Value Ref Range   Color, Urine YELLOW YELLOW   APPearance CLEAR CLEAR   Specific Gravity, Urine <1.005 (L) 1.005 - 1.030   pH 6.0 5.0 - 8.0   Glucose, UA NEGATIVE NEGATIVE mg/dL   Hgb urine dipstick NEGATIVE NEGATIVE   Bilirubin Urine NEGATIVE NEGATIVE   Ketones, ur NEGATIVE NEGATIVE mg/dL   Protein, ur NEGATIVE NEGATIVE mg/dL   Nitrite NEGATIVE NEGATIVE   Leukocytes,Ua NEGATIVE NEGATIVE    Comment: Microscopic not done on urines with negative protein, blood, leukocytes, nitrite, or glucose < 500 mg/dL. Performed at [facility], [address] [place], [place] [address]    US Abdomen Limited Ruq  Result Date: 11/09/2018 CLINICAL DATA:  Nausea and vomiting for 1 month EXAM: ULTRASOUND ABDOMEN LIMITED RIGHT UPPER QUADRANT COMPARISON:  None. FINDINGS: Gallbladder: Sludge is noted in the gallbladder. Gallbladder wall is normal. There is no pericholecystic fluid. The ultrasound technologist reports no sonographic Murphy sign. Common bile duct: Diameter: 2.2 mm Liver: There is heterogeneous increased echotexture of the liver. These findings limits evaluation for focal liver lesion. Portal vein is patent on color Doppler imaging with normal direction of blood flow towards the liver. Other: None. IMPRESSION: Sludge in the gallbladder.  No evidence of acute cholecystitis. Heterogeneous increased echotexture of the liver. This is nonspecific. This can be seen in fatty infiltration of liver. Electronically Signed   By: Sherian Rein M.D.   On: 11/09/2018 20:32    Pending Labs Unresulted Labs (From admission, onward)    Start     Ordered   11/09/18 2102  SARS CORONAVIRUS 2 (TAT 6-24 HRS) Nasopharyngeal Nasopharyngeal Swab  (Asymptomatic/Tier 2 Patients Labs)  Once,   STAT    Question Answer Comment  Is this test for diagnosis or screening Screening   Symptomatic for COVID-19 as defined by CDC No   Hospitalized for COVID-19  No   Admitted to ICU for COVID-19 No   Previously tested for COVID-19 No   Resident in a congregate (group) care setting No   Employed in healthcare setting No      11/09/18 2101  Vitals/Pain Today's Vitals   11/09/18 1727 11/09/18 1728 11/09/18 2046  BP: (!) 148/94  (!) 176/85  Pulse: 79  84  Resp: 16  16  Temp: 98.3 F (36.8 C)  98.6 F (37 C)  TempSrc: Oral  Oral  SpO2: 98%  97%  Weight:  91.5 kg   PainSc:  0-No pain 1     Isolation Precautions No active isolations  Medications Medications  sodium chloride 0.9 % bolus 1,000 mL (0 mLs Intravenous Stopped 11/09/18 2116)    Mobility walks Low fall risk   Focused Assessments stable, independently ambulatory   R Recommendations: See Admitting Provider Note  Report given to:   Additional Notes:

## 2018-11-09 NOTE — ED Notes (Signed)
Report attempted 

## 2018-11-09 NOTE — ED Notes (Signed)
Pt states he thinks he has acid reflux x2 months, has been vomiting every morning only in the am. Not able to eat/drink and keep it down. N/V no D, no abd pain, nontender abd. A&Ox4. Also concerned about rash on head which appears to be dry skin.

## 2018-11-09 NOTE — ED Provider Notes (Signed)
[facility] EMERGENCY DEPARTMENT Provider Note   CSN: [id number] Arrival date & time: 11/09/18  1720     History   Chief Complaint Chief Complaint  Patient presents with  . Emesis    HPI Miguel Harrison is a 48 y.o. male with PMHx HTN, sickle cell trait, and gout who presents to the ED today complaining of NBNB emesis and diarrhea intermittently for the past month. Pt reports he had a colonoscopy on 07/17 and believes his symptoms started shortly after that. Per chart review pt was seen at his PCP's office on 08/04 for complaints of emesis (symptoms appear to have been going on for 2 months). Pt states 5 out of 7 days he will wake up and have to vomit. He also endorses diarrhea. He states that the symptoms resolve in the afternoon but occur again in the morning time. Pt states he had bloodwork done by his PCP and was started on a PPI without resolution of his symptoms. He has never had any symptoms like this prior to his colonoscopy - no polyps were seen and pt was told to return in 10 years for repeat. He does endorse heavy EtOH use - reports he drinks #3 40 ounces of beer every day. Denies fever, chills, abdominal pain, constipation, melena, hematochezia, coffee ground emesis, urinary complaints, or any other associated symptoms.        Past Medical History:  Diagnosis Date  . Gout   . Hypertension   . Sickle cell trait [facility](HCC)     Patient Active Problem List   Diagnosis Date Noted  . Hyponatremia 11/09/2018  . Bilateral low back pain 11/01/2018  . Essential hypertension 01/19/2018  . Chronic gout involving toe without tophus 01/19/2018    Past Surgical History:  Procedure Laterality Date  . NECK SURGERY          Home Medications    Prior to Admission medications   Medication Sig Start Date End Date Taking? Authorizing Provider  allopurinol (ZYLOPRIM) 100 MG tablet Take 1 tablet (100 mg total) by mouth daily. 09/12/18   Wendling, Jilda RocheNicholas Paul, DO   amLODipine-valsartan (EXFORGE) 10-320 MG tablet Take 1 tablet by mouth daily. 09/12/18   Sharlene DoryWendling, Nicholas Paul, DO  pantoprazole (PROTONIX) 40 MG tablet Take 1 tablet (40 mg total) by mouth daily. 10/31/18   Sharlene DoryWendling, Nicholas Paul, DO    Family History Family History  Problem Relation Age of Onset  . Hypertension Mother   . Colon cancer Neg Hx   . Colon polyps Neg Hx   . Esophageal cancer Neg Hx   . Rectal cancer Neg Hx   . Stomach cancer Neg Hx     Social History Social History   Tobacco Use  . Smoking status: Current Some Day Smoker    Types: Cigars  . Smokeless tobacco: Never Used  . Tobacco comment: 1 cigar /week  Substance Use Topics  . Alcohol use: Yes    Alcohol/week: 21.0 standard drinks    Types: 21 Cans of beer per week    Comment: 3 beers daily  . Drug use: No     Allergies   Patient has no known allergies.   Review of Systems Review of Systems  Constitutional: Negative for chills and fever.  HENT: Negative for congestion.   Eyes: Negative for visual disturbance.  Respiratory: Negative for cough and shortness of breath.   Cardiovascular: Negative for chest pain.  Gastrointestinal: Positive for diarrhea and vomiting. Negative for abdominal pain and constipation.  Genitourinary: Negative for difficulty urinating, flank pain and frequency.  Musculoskeletal: Negative for myalgias.  Skin: Negative for rash.  Neurological: Negative for headaches.     Physical Exam Updated Vital Signs BP (!) 148/94 (BP Location: Left Arm)   Pulse 79   Temp 98.3 F (36.8 C) (Oral)   Resp 16   Wt 91.5 kg   SpO2 98%   BMI 28.96 kg/m   Physical Exam Vitals signs and nursing note reviewed.  Constitutional:      Appearance: Normal appearance. He is not ill-appearing.  HENT:     Head: Normocephalic and atraumatic.  Eyes:     Conjunctiva/sclera: Conjunctivae normal.  Neck:     Musculoskeletal: Neck supple.  Cardiovascular:     Rate and Rhythm: Normal rate and  regular rhythm.  Pulmonary:     Effort: Pulmonary effort is normal.     Breath sounds: Normal breath sounds.  Abdominal:     Palpations: Abdomen is soft.     Tenderness: There is abdominal tenderness. There is no guarding or rebound.     Comments: Soft, mild tenderness to LUQ, epigastrium, and RUQ, +BS throughout, no r/g/r, neg murphy's, neg mcburney's, no CVA TTP   Skin:    General: Skin is warm and dry.  Neurological:     Mental Status: He is alert.      ED Treatments / Results  Labs (all labs ordered are listed, but only abnormal results are displayed) Labs Reviewed  COMPREHENSIVE METABOLIC PANEL - Abnormal; Notable for the following components:      Result Value   Sodium 122 (*)    Chloride 86 (*)    CO2 21 (*)    Glucose, Bld 104 (*)    BUN 5 (*)    AST 245 (*)    ALT 146 (*)    Total Bilirubin 2.0 (*)    All other components within normal limits  LIPASE, BLOOD - Abnormal; Notable for the following components:   Lipase 65 (*)    All other components within normal limits  CBC WITH DIFFERENTIAL/PLATELET - Abnormal; Notable for the following components:   RBC 2.68 (*)    Hemoglobin 9.5 (*)    HCT 26.0 (*)    MCH 35.4 (*)    MCHC 36.5 (*)    Platelets 124 (*)    All other components within normal limits  URINALYSIS, ROUTINE W REFLEX MICROSCOPIC - Abnormal; Notable for the following components:   Specific Gravity, Urine <1.005 (*)    All other components within normal limits  SARS CORONAVIRUS 2 (TAT 6-24 HRS)    EKG None  Radiology US Abdomen Limited Ruq  Result Date: 11/09/2018 CLINICAL DATA:  Nausea and vomiting for 1 month EXAM: ULTRASOUND ABDOMEN LIMITED RIGHT UPPER QUADRANT COMPARISON:  None. FINDINGS: Gallbladder: Sludge is noted in the gallbladder. Gallbladder wall is normal. There is no pericholecystic fluid. The ultrasound technologist reports no sonographic Murphy sign. Common bile duct: Diameter: 2.2 mm Liver: There is heterogeneous increased  echotexture of the liver. These findings limits evaluation for focal liver lesion. Portal vein is patent on color Doppler imaging with normal direction of blood flow towards the liver. Other: None. IMPRESSION: Sludge in the gallbladder.  No evidence of acute cholecystitis. Heterogeneous increased echotexture of the liver. This is nonspecific. This can be seen in fatty infiltration of liver. Electronically Signed   By: Sherian Rein M.D.   On: 11/09/2018 20:32    Procedures Procedures (including critical care time)  Medications  Ordered in ED Medications  sodium chloride 0.9 % bolus 1,000 mL (0 mLs Intravenous Stopped 11/09/18 2116)     Initial Impression / Assessment and Plan / ED Course  I have reviewed the triage vital signs and the nursing notes.  Pertinent labs & imaging results that were available during my care of the patient were reviewed by me and considered in my medical decision making (see chart for details).    48 year old male who presents to the ED today complaining of vomiting and diarrhea intermittently for the past 2 months.  Patient has been evaluated by PCP via telemedicine was started on PPI concern for possible gastroenteritis.  He states he has been taking this without relief.  Patient does endorse heavy alcohol use and states he drinks about 3 40 ounce beers per day.  She does have tenderness to the left upper quadrant, epigastrium, right upper quadrant areas.  Given his symptoms have been chronic will obtain blood work at this time and reevaluate.  If any abnormalities will consider imaging.  Not currently having any pain, nausea.  Treat with IV fluids.  Hyponatremia present at 122.  Patient also has decreased chloride level.  Creatinine within normal limits.  AST and ALT and total bili elevated today.  Consistent with alcohol use.  Patient has right upper quadrant pain as well as increase in LFTs will obtain right upper quadrant ultrasound at this time.  Lipase is slightly  elevated at 65 but not 3 times the upper limit of normal to suggest any pancreatitis today.  There is no leukocytosis today.   Upper quadrant ultrasound does show gallbladder sludge no evidence of acute cholecystitis. Pt will need to be admitted at this time for hyponatremia - will consult hospitalist.   Discussed case with Dr. Posey Pronto who agrees to accept patient for admission.   This note was prepared using Dragon voice recognition software and may include unintentional dictation errors due to the inherent limitations of voice recognition software.       Final Clinical Impressions(s) / ED Diagnoses   Final diagnoses:  Epigastric pain  Hyponatremia  Elevated LFTs  Gallbladder sludge    ED Discharge Orders    None       Eustaquio Maize, PA-C 11/09/18 2256    Malvin Johns, MD 11/09/18 2320

## 2018-11-09 NOTE — ED Triage Notes (Addendum)
Pt is here due to nausea and vomiting in the am (about 5/7 mornings a week) for several weeks.  Pt has has spoken with MD about this and was prescribed an antacid.  Pt is here for further evaluation.  Pt denies any abdominal pain or other symtoms and states that the am that he does have this issue he feels fine after he vomits.

## 2018-11-10 DIAGNOSIS — F1010 Alcohol abuse, uncomplicated: Secondary | ICD-10-CM | POA: Diagnosis present

## 2018-11-10 DIAGNOSIS — I10 Essential (primary) hypertension: Secondary | ICD-10-CM

## 2018-11-10 DIAGNOSIS — Z716 Tobacco abuse counseling: Secondary | ICD-10-CM | POA: Diagnosis not present

## 2018-11-10 DIAGNOSIS — E871 Hypo-osmolality and hyponatremia: Secondary | ICD-10-CM

## 2018-11-10 DIAGNOSIS — R7989 Other specified abnormal findings of blood chemistry: Secondary | ICD-10-CM | POA: Diagnosis not present

## 2018-11-10 DIAGNOSIS — R197 Diarrhea, unspecified: Secondary | ICD-10-CM

## 2018-11-10 DIAGNOSIS — R112 Nausea with vomiting, unspecified: Secondary | ICD-10-CM | POA: Diagnosis not present

## 2018-11-10 DIAGNOSIS — F17290 Nicotine dependence, other tobacco product, uncomplicated: Secondary | ICD-10-CM | POA: Diagnosis not present

## 2018-11-10 DIAGNOSIS — Z79899 Other long term (current) drug therapy: Secondary | ICD-10-CM | POA: Diagnosis not present

## 2018-11-10 DIAGNOSIS — E860 Dehydration: Secondary | ICD-10-CM | POA: Diagnosis not present

## 2018-11-10 DIAGNOSIS — D649 Anemia, unspecified: Secondary | ICD-10-CM | POA: Diagnosis not present

## 2018-11-10 DIAGNOSIS — Z20828 Contact with and (suspected) exposure to other viral communicable diseases: Secondary | ICD-10-CM | POA: Diagnosis not present

## 2018-11-10 DIAGNOSIS — R1013 Epigastric pain: Secondary | ICD-10-CM | POA: Diagnosis present

## 2018-11-10 DIAGNOSIS — Z7141 Alcohol abuse counseling and surveillance of alcoholic: Secondary | ICD-10-CM | POA: Diagnosis not present

## 2018-11-10 DIAGNOSIS — Z720 Tobacco use: Secondary | ICD-10-CM | POA: Diagnosis present

## 2018-11-10 DIAGNOSIS — M1A9XX0 Chronic gout, unspecified, without tophus (tophi): Secondary | ICD-10-CM | POA: Diagnosis not present

## 2018-11-10 DIAGNOSIS — Z8249 Family history of ischemic heart disease and other diseases of the circulatory system: Secondary | ICD-10-CM | POA: Diagnosis not present

## 2018-11-10 DIAGNOSIS — K219 Gastro-esophageal reflux disease without esophagitis: Secondary | ICD-10-CM | POA: Diagnosis present

## 2018-11-10 DIAGNOSIS — D573 Sickle-cell trait: Secondary | ICD-10-CM | POA: Diagnosis not present

## 2018-11-10 LAB — BASIC METABOLIC PANEL
Anion gap: 13 (ref 5–15)
Anion gap: 9 (ref 5–15)
Anion gap: 12 (ref 5–15)
Anion gap: 11 (ref 5–15)
BUN: 5 mg/dL — ABNORMAL LOW (ref 6–20)
BUN: 6 mg/dL (ref 6–20)
BUN: 6 mg/dL (ref 6–20)
BUN: 7 mg/dL (ref 6–20)
CO2: 20 mmol/L — ABNORMAL LOW (ref 22–32)
CO2: 25 mmol/L (ref 22–32)
CO2: 20 mmol/L — ABNORMAL LOW (ref 22–32)
CO2: 25 mmol/L (ref 22–32)
Calcium: 8.8 mg/dL — ABNORMAL LOW (ref 8.9–10.3)
Calcium: 8.7 mg/dL — ABNORMAL LOW (ref 8.9–10.3)
Calcium: 8.7 mg/dL — ABNORMAL LOW (ref 8.9–10.3)
Calcium: 9.1 mg/dL (ref 8.9–10.3)
Chloride: 98 mmol/L (ref 98–111)
Chloride: 99 mmol/L (ref 98–111)
Chloride: 100 mmol/L (ref 98–111)
Chloride: 98 mmol/L (ref 98–111)
Creatinine, Ser: 0.79 mg/dL (ref 0.61–1.24)
Creatinine, Ser: 0.87 mg/dL (ref 0.61–1.24)
Creatinine, Ser: 0.89 mg/dL (ref 0.61–1.24)
Creatinine, Ser: 0.84 mg/dL (ref 0.61–1.24)
GFR calc Af Amer: >60 mL/min (ref >60)
GFR calc Af Amer: >60 mL/min (ref >60)
GFR calc Af Amer: >60 mL/min (ref >60)
GFR calc Af Amer: >60 mL/min (ref >60)
GFR calc non Af Amer: >60 mL/min (ref >60)
GFR calc non Af Amer: >60 mL/min (ref >60)
GFR calc non Af Amer: >60 mL/min (ref >60)
GFR calc non Af Amer: >60 mL/min (ref >60)
Glucose, Bld: 115 mg/dL — ABNORMAL HIGH (ref 70–99)
Glucose, Bld: 107 mg/dL — ABNORMAL HIGH (ref 70–99)
Glucose, Bld: 122 mg/dL — ABNORMAL HIGH (ref 70–99)
Glucose, Bld: 77 mg/dL (ref 70–99)
Potassium: 4.1 mmol/L (ref 3.5–5.1)
Potassium: 4.6 mmol/L (ref 3.5–5.1)
Potassium: 4.5 mmol/L (ref 3.5–5.1)
Potassium: 3.9 mmol/L (ref 3.5–5.1)
Sodium: 131 mmol/L — ABNORMAL LOW (ref 135–145)
Sodium: 133 mmol/L — ABNORMAL LOW (ref 135–145)
Sodium: 132 mmol/L — ABNORMAL LOW (ref 135–145)
Sodium: 134 mmol/L — ABNORMAL LOW (ref 135–145)

## 2018-11-10 LAB — CBC
HCT: 24.1 % — ABNORMAL LOW (ref 39.0–52.0)
Hemoglobin: 8.9 g/dL — ABNORMAL LOW (ref 13.0–17.0)
MCH: 36.2 pg — ABNORMAL HIGH (ref 26.0–34.0)
MCHC: 36.9 g/dL — ABNORMAL HIGH (ref 30.0–36.0)
MCV: 98 fL (ref 80.0–100.0)
Platelets: 117 10*3/uL — ABNORMAL LOW (ref 150–400)
RBC: 2.46 MIL/uL — ABNORMAL LOW (ref 4.22–5.81)
RDW: 12.4 % (ref 11.5–15.5)
WBC: 4.3 10*3/uL (ref 4.0–10.5)
nRBC: 0 % (ref 0.0–0.2)

## 2018-11-10 LAB — TSH: TSH: 1.015 u[IU]/mL (ref 0.350–4.500)

## 2018-11-10 LAB — COMPREHENSIVE METABOLIC PANEL
ALT: 125 U/L — ABNORMAL HIGH (ref 0–44)
AST: 200 U/L — ABNORMAL HIGH (ref 15–41)
Albumin: 3.3 g/dL — ABNORMAL LOW (ref 3.5–5.0)
Alkaline Phosphatase: 102 U/L (ref 38–126)
Anion gap: 12 (ref 5–15)
BUN: 5 mg/dL — ABNORMAL LOW (ref 6–20)
CO2: 22 mmol/L (ref 22–32)
Calcium: 8.9 mg/dL (ref 8.9–10.3)
Chloride: 98 mmol/L (ref 98–111)
Creatinine, Ser: 0.83 mg/dL (ref 0.61–1.24)
GFR calc Af Amer: >60 mL/min (ref >60)
GFR calc non Af Amer: >60 mL/min (ref >60)
Glucose, Bld: 114 mg/dL — ABNORMAL HIGH (ref 70–99)
Potassium: 4.1 mmol/L (ref 3.5–5.1)
Sodium: 132 mmol/L — ABNORMAL LOW (ref 135–145)
Total Bilirubin: 1.8 mg/dL — ABNORMAL HIGH (ref 0.3–1.2)
Total Protein: 6.8 g/dL (ref 6.5–8.1)

## 2018-11-10 LAB — SODIUM, URINE, RANDOM: Sodium, Ur: <10 mmol/L

## 2018-11-10 LAB — RAPID URINE DRUG SCREEN, HOSP PERFORMED
Amphetamines: NOT DETECTED
Barbiturates: NOT DETECTED
Benzodiazepines: NOT DETECTED
Cocaine: NOT DETECTED
Opiates: NOT DETECTED
Tetrahydrocannabinol: NOT DETECTED

## 2018-11-10 LAB — HEPATITIS PANEL, ACUTE
HCV Ab: NONREACTIVE
Hep A IgM: NONREACTIVE
Hep B C IgM: NONREACTIVE
Hepatitis B Surface Ag: NONREACTIVE

## 2018-11-10 LAB — OSMOLALITY, URINE: Osmolality, Ur: 95 mOsm/kg — ABNORMAL LOW (ref 300–900)

## 2018-11-10 LAB — SARS CORONAVIRUS 2 (TAT 6-24 HRS): SARS Coronavirus 2: NEGATIVE

## 2018-11-10 MED ORDER — LORAZEPAM 2 MG/ML IJ SOLN: 0.0000 mg | Freq: Four times a day (QID) | INTRAMUSCULAR | Status: DC

## 2018-11-10 MED ORDER — SODIUM CHLORIDE 0.9 % IV SOLN: INTRAVENOUS | Status: DC

## 2018-11-10 MED ORDER — DEXTROSE 5 % IV SOLN
INTRAVENOUS | Status: DC
  Administered 2018-11-10: 14:00:00 via INTRAVENOUS

## 2018-11-10 MED ORDER — LORAZEPAM 1 MG PO TABS: 1.0000 mg | ORAL_TABLET | ORAL | Status: DC | PRN

## 2018-11-10 MED ORDER — SODIUM CHLORIDE 0.9 % IV SOLN
INTRAVENOUS | Status: DC
  Administered 2018-11-10: 02:00:00 via INTRAVENOUS

## 2018-11-10 MED ORDER — ENSURE ENLIVE PO LIQD
237.0000 mL | ORAL | Status: DC
  Administered 2018-11-10: 237 mL via ORAL

## 2018-11-10 MED ORDER — ADULT MULTIVITAMIN W/MINERALS CH
1.0000 | ORAL_TABLET | Freq: Every day | ORAL | Status: DC
  Administered 2018-11-10 – 2018-11-11 (×2): 1 via ORAL
  Filled 2018-11-10 (×2): qty 1

## 2018-11-10 MED ORDER — AMLODIPINE BESYLATE 10 MG PO TABS
10.0000 mg | ORAL_TABLET | Freq: Every day | ORAL | Status: DC
  Administered 2018-11-10 – 2018-11-11 (×2): 10 mg via ORAL
  Filled 2018-11-10 (×3): qty 1

## 2018-11-10 MED ORDER — ONDANSETRON HCL 4 MG/2ML IJ SOLN: 4.0000 mg | Freq: Three times a day (TID) | INTRAMUSCULAR | Status: DC | PRN

## 2018-11-10 MED ORDER — LORAZEPAM 2 MG/ML IJ SOLN: 0.0000 mg | Freq: Two times a day (BID) | INTRAMUSCULAR | Status: DC

## 2018-11-10 MED ORDER — NICOTINE 21 MG/24HR TD PT24
21.0000 mg | MEDICATED_PATCH | Freq: Every day | TRANSDERMAL | Status: DC
  Filled 2018-11-10 (×2): qty 1

## 2018-11-10 MED ORDER — ALLOPURINOL 100 MG PO TABS
100.0000 mg | ORAL_TABLET | Freq: Every day | ORAL | Status: DC
  Administered 2018-11-10 – 2018-11-11 (×2): 100 mg via ORAL
  Filled 2018-11-10 (×2): qty 1

## 2018-11-10 MED ORDER — FOLIC ACID 1 MG PO TABS
1.0000 mg | ORAL_TABLET | Freq: Every day | ORAL | Status: DC
  Administered 2018-11-10 – 2018-11-11 (×2): 1 mg via ORAL
  Filled 2018-11-10 (×2): qty 1

## 2018-11-10 MED ORDER — BOOST / RESOURCE BREEZE PO LIQD CUSTOM
1.0000 | ORAL | Status: DC
  Administered 2018-11-10: 1 via ORAL

## 2018-11-10 MED ORDER — VITAMIN B-1 100 MG PO TABS
100.0000 mg | ORAL_TABLET | Freq: Every day | ORAL | Status: DC
  Administered 2018-11-10 – 2018-11-11 (×2): 100 mg via ORAL
  Filled 2018-11-10 (×2): qty 1

## 2018-11-10 MED ORDER — HYDRALAZINE HCL 25 MG PO TABS
25.0000 mg | ORAL_TABLET | Freq: Three times a day (TID) | ORAL | Status: DC | PRN
  Administered 2018-11-11: 07:00:00 25 mg via ORAL
  Filled 2018-11-10 (×3): qty 1

## 2018-11-10 MED ORDER — THIAMINE HCL 100 MG/ML IJ SOLN
100.0000 mg | Freq: Every day | INTRAMUSCULAR | Status: DC
  Filled 2018-11-10: qty 1

## 2018-11-10 MED ORDER — LORAZEPAM 2 MG/ML IJ SOLN: 1.0000 mg | INTRAMUSCULAR | Status: DC | PRN

## 2018-11-10 MED ORDER — FAMOTIDINE IN NACL 20-0.9 MG/50ML-% IV SOLN
20.0000 mg | Freq: Two times a day (BID) | INTRAVENOUS | Status: DC
  Administered 2018-11-10: 02:00:00 20 mg via INTRAVENOUS
  Filled 2018-11-10: qty 50

## 2018-11-10 MED ORDER — ENOXAPARIN SODIUM 40 MG/0.4ML ~~LOC~~ SOLN
40.0000 mg | Freq: Every day | SUBCUTANEOUS | Status: DC
  Administered 2018-11-10 – 2018-11-11 (×2): 40 mg via SUBCUTANEOUS
  Filled 2018-11-10 (×2): qty 0.4

## 2018-11-10 MED ORDER — PANTOPRAZOLE SODIUM 40 MG PO TBEC
40.0000 mg | DELAYED_RELEASE_TABLET | Freq: Every day | ORAL | Status: DC
  Administered 2018-11-10 – 2018-11-11 (×2): 40 mg via ORAL
  Filled 2018-11-10 (×2): qty 1

## 2018-11-10 NOTE — H&P (Signed)
History and Physical    Butler Vegh OEV:[id number] DOB: 12-07-1970 DOA: 11/09/2018  Referring MD/NP/PA:   PCP: Shelda Pal, DO   Patient coming from:  The patient is coming from home.  At baseline, pt is independent for most of ADL.        Chief Complaint: Nausea, vomiting, diarrhea  HPI: Miguel Harrison is a 48 y.o. male with medical history significant of hypertension, GERD, gout, sickle cell trait, alcohol abuse, tobacco abuse, who presents with nausea, vomiting, diarrhea.  Patient states that he has been having nausea, vomiting, diarrhea intermittently for about 1 month.  He states that he vomited twice in the morning today, with nonbilious nonbloody vomitus.  He did not have diarrhea today.  Denies abdominal pain.  No fever or chills.  Patient does not have chest pain, shortness of breath, cough, symptoms of UTI or unilateral weakness.  ED Course: pt was found to have sodium 122, WBC 4.8, lipase 65, pending COVID-19 test, negative urinalysis, abnormal liver function (ALP 108, AST 245, ALT 146, total bilirubin 2.0), renal function okay.  Temperature normal, blood pressure 174/105, tachycardia, oxygen saturation 100% on room air.  Sherald Hess showed sludge in the gallbladder, but no evidence of acute cholecystitis. Pt is placed on med-surg bed for obs.   Review of Systems:   General: no fevers, chills, no body weight gain, has poor appetite, has fatigue HEENT: no blurry vision, hearing changes or sore throat Respiratory: no dyspnea, coughing, wheezing CV: no chest pain, no palpitations GI: has nausea, vomiting, diarrhea, no AP or constipation GU: no dysuria, burning on urination, increased urinary frequency, hematuria  Ext: no leg edema Neuro: no unilateral weakness, numbness, or tingling, no vision change or hearing loss Skin: no rash, no skin tear. MSK: No muscle spasm, no deformity, no limitation of range of movement in spin Heme: No easy bruising.  Travel  history: No recent long distant travel.  Allergy: No Known Allergies  Past Medical History:  Diagnosis Date  . Gout   . Hypertension   . Sickle cell trait [name])     Past Surgical History:  Procedure Laterality Date  . NECK SURGERY      Social History:  reports that he has been smoking cigars. He has never used smokeless tobacco. He reports current alcohol use of about 21.0 standard drinks of alcohol per week. He reports that he does not use drugs.  Family History:  Family History  Problem Relation Age of Onset  . Hypertension Mother   . Colon cancer Neg Hx   . Colon polyps Neg Hx   . Esophageal cancer Neg Hx   . Rectal cancer Neg Hx   . Stomach cancer Neg Hx      Prior to Admission medications   Medication Sig Start Date End Date Taking? Authorizing Provider  allopurinol (ZYLOPRIM) 100 MG tablet Take 1 tablet (100 mg total) by mouth daily. 09/12/18   Wendling, Crosby Oyster, DO  amLODipine-valsartan (EXFORGE) 10-320 MG tablet Take 1 tablet by mouth daily. 09/12/18   Shelda Pal, DO  pantoprazole (PROTONIX) 40 MG tablet Take 1 tablet (40 mg total) by mouth daily. 10/31/18   Shelda Pal, DO    Physical Exam: Vitals:   11/09/18 2046 11/09/18 2250 11/10/18 0050 11/10/18 0450  BP: (!) 176/85 (!) 180/88 (!) 174/105 (!) 143/91  Pulse: 84 (!) 102 (!) 109 82  Resp: 16 18 16 16   Temp: 98.6 F (37 C) 98.7 F (37.1 C) 98.7 F (37.1  C) 99 F (37.2 C)  TempSrc: Oral Oral Oral Oral  SpO2: 97% 99% 100% 100%  Weight:       General: Not in acute distress HEENT:       Eyes: PERRL, EOMI, no scleral icterus.       ENT: No discharge from the ears and nose, no pharynx injection, no tonsillar enlargement.        Neck: No JVD, no bruit, no mass felt. Heme: No neck lymph node enlargement. Cardiac: S1/S2, RRR, No murmurs, No gallops or rubs. Respiratory: No rales, wheezing, rhonchi or rubs. GI: Soft, nondistended, nontender, no rebound pain, no organomegaly, BS  present. GU: No hematuria Ext: No pitting leg edema bilaterally. 2+DP/PT pulse bilaterally. Musculoskeletal: No joint deformities, No joint redness or warmth, no limitation of ROM in spin. Skin: No rashes.  Neuro: Alert, oriented X3, cranial nerves II-XII grossly intact, moves all extremities normally. Psych: Patient is not psychotic, no suicidal or hemocidal ideation.  Labs on Admission: I have personally reviewed following labs and imaging studies  CBC: Recent Labs  Lab 11/09/18 1847 11/10/18 0138  WBC 4.8 4.3  NEUTROABS 2.6  --   HGB 9.5* 8.9*  HCT 26.0* 24.1*  MCV 97.0 98.0  PLT 124* 117*   Basic Metabolic Panel: Recent Labs  Lab 11/09/18 1847 11/10/18 0138  NA 122* 132*  131*  K 4.2 4.1  4.1  CL 86* 98  98  CO2 21* 22  20*  GLUCOSE 104* 114*  115*  BUN 5* 5*  5*  CREATININE 0.77 0.83  0.79  CALCIUM 9.1 8.9  8.8*   GFR: Estimated Creatinine Clearance: 121.1 mL/min (by C-G formula based on SCr of 0.83 mg/dL). Liver Function Tests: Recent Labs  Lab 11/09/18 1847 11/10/18 0138  AST 245* 200*  ALT 146* 125*  ALKPHOS 108 102  BILITOT 2.0* 1.8*  PROT 7.9 6.8  ALBUMIN 3.8 3.3*   Recent Labs  Lab 11/09/18 1847  LIPASE 65*   No results for input(s): AMMONIA in the last 168 hours. Coagulation Profile: No results for input(s): INR, PROTIME in the last 168 hours. Cardiac Enzymes: No results for input(s): CKTOTAL, CKMB, CKMBINDEX, TROPONINI in the last 168 hours. BNP (last 3 results) No results for input(s): PROBNP in the last 8760 hours. HbA1C: No results for input(s): HGBA1C in the last 72 hours. CBG: No results for input(s): GLUCAP in the last 168 hours. Lipid Profile: No results for input(s): CHOL, HDL, LDLCALC, TRIG, CHOLHDL, LDLDIRECT in the last 72 hours. Thyroid Function Tests: Recent Labs    11/10/18 0138  TSH 1.015   Anemia Panel: No results for input(s): VITAMINB12, FOLATE, FERRITIN, TIBC, IRON, RETICCTPCT in the last 72  hours. Urine analysis:    Component Value Date/Time   COLORURINE YELLOW 11/09/2018 1847   APPEARANCEUR CLEAR 11/09/2018 1847   LABSPEC <1.005 (L) 11/09/2018 1847   PHURINE 6.0 11/09/2018 1847   GLUCOSEU NEGATIVE 11/09/2018 1847   HGBUR NEGATIVE 11/09/2018 1847   BILIRUBINUR NEGATIVE 11/09/2018 1847   KETONESUR NEGATIVE 11/09/2018 1847   PROTEINUR NEGATIVE 11/09/2018 1847   NITRITE NEGATIVE 11/09/2018 1847   LEUKOCYTESUR NEGATIVE 11/09/2018 1847   Sepsis Labs: @LABRCNTIP (procalcitonin:4,lacticidven:4) ) Recent Results (from the past 240 hour(s))  SARS CORONAVIRUS 2 (TAT 6-24 HRS) Nasopharyngeal Nasopharyngeal Swab     Status: None   Collection Time: 11/09/18  9:15 PM   Specimen: Nasopharyngeal Swab  Result Value Ref Range Status   SARS Coronavirus 2 NEGATIVE NEGATIVE Final    Comment: (  NOTE) SARS-CoV-2 target nucleic acids are NOT DETECTED. The SARS-CoV-2 RNA is generally detectable in upper and lower respiratory specimens during the acute phase of infection. Negative results do not preclude SARS-CoV-2 infection, do not rule out co-infections with other pathogens, and should not be used as the sole basis for treatment or other patient management decisions. Negative results must be combined with clinical observations, patient history, and epidemiological information. The expected result is Negative. Fact Sheet for Patients: HairSlick.no Fact Sheet for Healthcare Providers: quierodirigir.com This test is not yet approved or cleared by the [place] FDA and  has been authorized for detection and/or diagnosis of SARS-CoV-2 by FDA under an Emergency Use Authorization (EUA). This EUA will remain  in effect (meaning this test can be used) for the duration of the COVID-19 declaration under Section 56 4(b)(1) of the Act, 21 U.S.C. section 360bbb-3(b)(1), unless the authorization is terminated or revoked sooner. Performed at  [facility], [address], [place], [place] [address]      Radiological Exams on Admission: US Abdomen Limited Ruq  Result Date: 11/09/2018 CLINICAL DATA:  Nausea and vomiting for 1 month EXAM: ULTRASOUND ABDOMEN LIMITED RIGHT UPPER QUADRANT COMPARISON:  None. FINDINGS: Gallbladder: Sludge is noted in the gallbladder. Gallbladder wall is normal. There is no pericholecystic fluid. The ultrasound technologist reports no sonographic Murphy sign. Common bile duct: Diameter: 2.2 mm Liver: There is heterogeneous increased echotexture of the liver. These findings limits evaluation for focal liver lesion. Portal vein is patent on color Doppler imaging with normal direction of blood flow towards the liver. Other: None. IMPRESSION: Sludge in the gallbladder.  No evidence of acute cholecystitis. Heterogeneous increased echotexture of the liver. This is nonspecific. This can be seen in fatty infiltration of liver. Electronically Signed   By: Sherian Rein M.D.   On: 11/09/2018 20:32     EKG:   Not done in ED, will get one.   Assessment/Plan Principal Problem:   Nausea vomiting and diarrhea Active Problems:   Essential hypertension   Chronic gout involving toe without tophus   Hyponatremia   Normocytic anemia   Elevated LFTs   Tobacco abuse   Alcohol abuse   Nausea vomiting and diarrhea: Etiology is not clear.  Patient has abnormal liver function, which is likely due to alcohol abuse.  Lipase 65.  Abdominal ultrasound showed sludge in gallbladder, no evidence of cholecystitis.  Patient denies abdominal pain to me. No fever or leukocytosis.  -Patient is placed on MedSurg bed of observation (patient was initially accepted by MD for observation, may need to change to inpatient due to severe hyponatremia, should not over corrected) -PRN Zofran for nausea -IV fluid: 1 L normal saline bolus, 75 cc/h.    Hyponatremia: likely due to GI loss, poor oral intake, dehydration, potomania due to  alcohol abuse. His sodium is 122 on 18:47 --> 131 at 1:38 which is corrected to too fast. He received 1L NS in ED. Will start D5 now  - Will check urine sodium, urine osmolality, serum osmolality. - check TSH  - Fluid restriction -->d/c'ed - start D5 at 70 cc/h - f/u by BMP q6h -Hold losartan   HTN:  -continue amlodipine -Hold off losartan due to hyponatremia -hydralazine prn  Chronic gout involving toe without tophus: -Continue amlodipine  Normocytic anemia: Hgb 13.3 on 01/19/18 --> 9.5 -check anemia panel  Elevated LFTs: likely due to alcohol abuse.  Lipase 65.  Abdominal ultrasound showed sludge in gallbladder, no evidence of cholecystitis. -  check HIV ab and hepatitis panel -Avoid using Tylenol  Tobacco abuse and Alcohol abuse: -Did counseling about importance of quitting smoking and drinking alcohol -Nicotine patch -CIWA protocol  GERD: -Protonix   DVT ppx: SQ Lovenox Code Status: Full code Family Communication: None at bed side.   Disposition Plan:  Anticipate discharge back to previous home environment Consults called:  none Admission status:  medical floor/obs      Date of Service 11/10/2018    Lorretta Harp [facility]   If 7PM-7AM, please contact night-coverage [email or web address] Password [facility] 11/10/2018, 6:39 AM

## 2018-11-10 NOTE — Progress Notes (Signed)
Initial Nutrition Assessment  DOCUMENTATION CODES:   Not applicable  INTERVENTION:  - will order Boost Breeze once/day, each supplement provides 250 kcal and 9 grams of protein. - will order Ensure Enlive once/day, each supplement provides 350 kcal and 20 grams of protein. - will order daily multivitamin with minerals. - encourage PO intakes and continue to advance diet as medically feasible.   NUTRITION DIAGNOSIS:   Increased nutrient needs related to acute illness as evidenced by estimated needs.  GOAL:   Patient will meet greater than or equal to 90% of their needs  MONITOR:   PO intake, Supplement acceptance, Diet advancement, Labs, Weight trends, I & O's  REASON FOR ASSESSMENT:   Malnutrition Screening Tool  ASSESSMENT:   48 y.o. male with medical history significant of HTN, GERD, gout, sickle cell trait, alcohol abuse, and tobacco abuse. He presented to the ED on 10/1 due to N/V/D which has been intermittently occurring over the past 1 month. He had 2 episodes of non-bilious, non-bloody emesis on 10/1 morning. Ultrasound of RUQ showed sludge in the gallbladder but no evidence of acute cholecystitis.  Patient has been on FLD since admission. Patient reports that during the past month he has been experiencing intermittent N/V which occurs 4-5 days/week and mainly occurs in the morning. He reports after he vomits he feels better and that he usually will not feel these symptoms again until the next morning. He has also been having intermittent diarrhea with no consistent patter. He is unsure of triggers for symptoms. Due to symptoms he skips breakfast, usually eats a light lunch, and then eats a large dinner.   Per chart review, current weight is 202 lb and weight on 7/7 was 207 lb. This indicates 5 lb weight loss (2.4% body weight) in the past 3 months; not significant for time frame.  Per notes: - N/V/D--abnormal liver function thought to be 2/2 alcohol abuse -  hyponatremia--improved, thought to be 2/2 GI losses, poor PO intake, dehydration, and potomania d/t alcohol abuse - normocytic anemia   Labs reviewed; Na: 133 mmol/l, Ca: 8.7 mg/dl. Medications reviewed; 1 mg folvite/day, 100 mg thiamine/day.  IVF; D5 @ 75 ml/hr (306 kcal).    NUTRITION - FOCUSED PHYSICAL EXAM:  completed to upper body; no muscle and no fat wasting noted.   Diet Order:   Diet Order            Diet full liquid Room service appropriate? Yes; Fluid consistency: Thin  Diet effective now              EDUCATION NEEDS:   No education needs have been identified at this time  Skin:  Skin Assessment: Reviewed RN Assessment  Last BM:  10/1  Height:   Ht Readings from Last 1 Encounters:  08/25/18 5\' 10"  (1.778 m)    Weight:   Wt Readings from Last 1 Encounters:  11/09/18 91.5 kg    Ideal Body Weight:  75.4 kg  BMI:  Body mass index is 28.96 kg/m.  Estimated Nutritional Needs:   Kcal:  2000-2200 kcal  Protein:  100-110 grams  Fluid:  >/= 2.5 L/day      Jarome Matin, MS, RD, LDN, [facility] Inpatient Clinical Dietitian Pager # [phone] After hours/weekend pager # [phone]

## 2018-11-10 NOTE — Progress Notes (Signed)
PROGRESS NOTE    Miguel Harrison  WPY:[id number] DOB: 1970-09-16 DOA: 11/09/2018 PCP: [name], DO    Brief Narrative:   Miguel Harrison is a 48 y.o. male with medical history significant of hypertension, GERD, gout, sickle cell trait, alcohol abuse, tobacco abuse, who presents with nausea, vomiting, diarrhea.  Patient states that he has been having nausea, vomiting, diarrhea intermittently for about 1 month.  He states that he vomited twice in the morning today, with nonbilious nonbloody vomitus.  He did not have diarrhea today.  Denies abdominal pain.  No fever or chills.  Patient does not have chest pain, shortness of breath, cough, symptoms of UTI or unilateral weakness.  ED Course: pt was found to have sodium 122, WBC 4.8, lipase 65, pending COVID-19 test, negative urinalysis, abnormal liver function (ALP 108, AST 245, ALT 146, total bilirubin 2.0), renal function okay.  Temperature normal, blood pressure 174/105, tachycardia, oxygen saturation 100% on room air.  Miguel Harrison showed sludge in the gallbladder, but no evidence of acute cholecystitis. Pt is placed on med-surg bed for obs.   Assessment & Plan:   Principal Problem:   Nausea vomiting and diarrhea Active Problems:   Essential hypertension   Chronic gout involving toe without tophus   Hyponatremia   Normocytic anemia   Elevated LFTs   Tobacco abuse   Alcohol abuse   GERD (gastroesophageal reflux disease)   Nausea vomiting and diarrhea:  Resolved Etiology is not clear.  Patient has abnormal liver function, which is likely due to alcohol abuse.  Lipase 65.  Abdominal ultrasound showed sludge in gallbladder, no evidence of cholecystitis.  Patient denies abdominal pain to me. No fever or leukocytosis.  Now resolved and tolerating diet without issue.  Hyponatremia: Etiology likely due to GI loss, poor oral intake, dehydration, potomania due to alcohol abuse. His sodium is 122 on 18:47 --> 131 at 1:38  which corrected to too fast. He received 1L NS in ED. urine osmolality 95, urine sodium less than 10.  TSH 1.015.  Was discontinued from normal saline infusion to D5W in which sodium has remained stable.  He is asymptomatic; without neurological deficit. --Na 122-->131-->133-->132 --Hold home losartan --Stop IV fluids --Repeat BMP in a.m.   HTN:  --continue amlodipine --Hold off losartan due to hyponatremia --hydralazine prn  Chronic gout involving toe without tophus: --Continue allopurinol  Normocytic anemia:  Hgb 13.3 on 01/19/18 --> 9.5 --Anemia panel pending  Elevated LFTs: likely due to alcohol abuse.  Lipase 65.  Abdominal ultrasound showed sludge in gallbladder, no evidence of cholecystitis.  Acute hepatitis panel negative. --Discussed EtOH cessation, avoid Tylenol use  Tobacco abuse and Alcohol abuse: Counseled on need for complete tobacco and alcohol cessation. --Nicotine patch --CIWA protocol --Folic acid, thiamine, multivitamin  GERD: Protonix   DVT prophylaxis: Lovenox Code Status: Full code Family Communication: None Disposition Plan: Anticipate discharge home, likely tomorrow   Consultants:   None  Procedures:   None  Antimicrobials:   None   Subjective: Patient seen and examined at bedside, resting comfortably.  Nausea vomiting has resolved.  Currently tolerating diet.  Continues on IV fluid hydration for hyponatremia, slightly improved.  Continues with some dizziness on standing from a laying position.  Discussed with patient extensively needs to sensate from all alcohol use.  No other complaints or concerns at this time.  Denies headache, no visual changes, no chest pain, no palpitations, no abdominal pain, no paresthesias.  No acute events overnight per nursing staff.  Objective: Vitals:  11/10/18 0050 11/10/18 0450 11/10/18 1005 11/10/18 1252  BP: (!) 174/105 (!) 143/91 (!) 166/92 (!) 147/95  Pulse: (!) 109 82 90 81  Resp: 16 16     Temp: 98.7 F (37.1 C) 99 F (37.2 C)    TempSrc: Oral Oral    SpO2: 100% 100% 100% 100%  Weight:        Intake/Output Summary (Last 24 hours) at 11/10/2018 1658 Last data filed at 11/10/2018 1400 Gross per 24 hour  Intake 1336.68 ml  Output 120 ml  Net 1216.68 ml   Filed Weights   11/09/18 1728  Weight: 91.5 kg    Examination:  General exam: Appears calm and comfortable  Respiratory system: Clear to auscultation. Respiratory effort normal. Cardiovascular system: S1 & S2 heard, RRR. No JVD, murmurs, rubs, gallops or clicks. No pedal edema. Gastrointestinal system: Abdomen is nondistended, soft and nontender. No organomegaly or masses felt. Normal bowel sounds heard. Central nervous system: Alert and oriented. No focal neurological deficits. Extremities: Symmetric 5 x 5 power. Skin: No rashes, lesions or ulcers Psychiatry: Judgement and insight appear normal. Mood & affect appropriate.     Data Reviewed: I have personally reviewed following labs and imaging studies  CBC: Recent Labs  Lab 11/09/18 1847 11/10/18 0138  WBC 4.8 4.3  NEUTROABS 2.6  --   HGB 9.5* 8.9*  HCT 26.0* 24.1*  MCV 97.0 98.0  PLT 124* 101*   Basic Metabolic Panel: Recent Labs  Lab 11/09/18 1847 11/10/18 0138 11/10/18 0659 11/10/18 1110  NA 122* 132*  131* 133* 132*  K 4.2 4.1  4.1 4.6 4.5  CL 86* 98  98 99 100  CO2 21* 22  20* 25 20*  GLUCOSE 104* 114*  115* 107* 122*  BUN 5* 5*  5* 6 6  CREATININE 0.77 0.83  0.79 0.87 0.89  CALCIUM 9.1 8.9  8.8* 8.7* 8.7*   GFR: Estimated Creatinine Clearance: 112.9 mL/min (by C-G formula based on SCr of 0.89 mg/dL). Liver Function Tests: Recent Labs  Lab 11/09/18 1847 11/10/18 0138  AST 245* 200*  ALT 146* 125*  ALKPHOS 108 102  BILITOT 2.0* 1.8*  PROT 7.9 6.8  ALBUMIN 3.8 3.3*   Recent Labs  Lab 11/09/18 1847  LIPASE 65*   No results for input(s): AMMONIA in the last 168 hours. Coagulation Profile: No results for input(s):  INR, PROTIME in the last 168 hours. Cardiac Enzymes: No results for input(s): CKTOTAL, CKMB, CKMBINDEX, TROPONINI in the last 168 hours. BNP (last 3 results) No results for input(s): PROBNP in the last 8760 hours. HbA1C: No results for input(s): HGBA1C in the last 72 hours. CBG: No results for input(s): GLUCAP in the last 168 hours. Lipid Profile: No results for input(s): CHOL, HDL, LDLCALC, TRIG, CHOLHDL, LDLDIRECT in the last 72 hours. Thyroid Function Tests: Recent Labs    11/10/18 0138  TSH 1.015   Anemia Panel: No results for input(s): VITAMINB12, FOLATE, FERRITIN, TIBC, IRON, RETICCTPCT in the last 72 hours. Sepsis Labs: No results for input(s): PROCALCITON, LATICACIDVEN in the last 168 hours.  Recent Results (from the past 240 hour(s))  SARS CORONAVIRUS 2 (TAT 6-24 HRS) Nasopharyngeal Nasopharyngeal Swab     Status: None   Collection Time: 11/09/18  9:15 PM   Specimen: Nasopharyngeal Swab  Result Value Ref Range Status   SARS Coronavirus 2 NEGATIVE NEGATIVE Final    Comment: (NOTE) SARS-CoV-2 target nucleic acids are NOT DETECTED. The SARS-CoV-2 RNA is generally detectable in upper and lower  respiratory specimens during the acute phase of infection. Negative results do not preclude SARS-CoV-2 infection, do not rule out co-infections with other pathogens, and should not be used as the sole basis for treatment or other patient management decisions. Negative results must be combined with clinical observations, patient history, and epidemiological information. The expected result is Negative. Fact Sheet for Patients: HairSlick.nohttps://www.fda.gov/media/138098/download Fact Sheet for Healthcare Providers: quierodirigir.comhttps://www.fda.gov/media/138095/download This test is not yet approved or cleared by the [place] FDA and  has been authorized for detection and/or diagnosis of SARS-CoV-2 by FDA under an Emergency Use Authorization (EUA). This EUA will remain  in effect (meaning this test  can be used) for the duration of the COVID-19 declaration under Section 56 4(b)(1) of the Act, 21 U.S.C. section 360bbb-3(b)(1), unless the authorization is terminated or revoked sooner. Performed at [facility], [address] [place], [place] [address]          Radiology Studies: Koreas Abdomen Limited Ruq  Result Date: 11/09/2018 CLINICAL DATA:  Nausea and vomiting for 1 month EXAM: ULTRASOUND ABDOMEN LIMITED RIGHT UPPER QUADRANT COMPARISON:  None. FINDINGS: Gallbladder: Sludge is noted in the gallbladder. Gallbladder wall is normal. There is no pericholecystic fluid. The ultrasound technologist reports no sonographic Murphy sign. Common bile duct: Diameter: 2.2 mm Liver: There is heterogeneous increased echotexture of the liver. These findings limits evaluation for focal liver lesion. Portal vein is patent on color Doppler imaging with normal direction of blood flow towards the liver. Other: None. IMPRESSION: Sludge in the gallbladder.  No evidence of acute cholecystitis. Heterogeneous increased echotexture of the liver. This is nonspecific. This can be seen in fatty infiltration of liver. Electronically Signed   By: Sherian ReinWei-Chen  Lin M.D.   On: 11/09/2018 20:32        Scheduled Meds: . allopurinol  100 mg Oral Daily  . amLODipine  10 mg Oral Daily  . enoxaparin (LOVENOX) injection  40 mg Subcutaneous Daily  . feeding supplement  1 Container Oral Q24H  . feeding supplement (ENSURE ENLIVE)  237 mL Oral Q24H  . folic acid  1 mg Oral Daily  . LORazepam  0-4 mg Intravenous Q6H   Followed by  . [START ON 11/12/2018] LORazepam  0-4 mg Intravenous Q12H  . multivitamin with minerals  1 tablet Oral Daily  . nicotine  21 mg Transdermal Daily  . pantoprazole  40 mg Oral Daily  . thiamine  100 mg Oral Daily   Or  . thiamine  100 mg Intravenous Daily   Continuous Infusions: . dextrose 75 mL/hr at 11/10/18 1354     LOS: 0 days    Time spent: 34 minutes spent on chart review,  discussion with nursing staff, consultants, updating family and interview/physical exam; more than 50% of that time was spent in counseling and/or coordination of care.    Alvira PhilipsEric J [place], DO [facility] Pager [phone]  If 7PM-7AM, please contact night-coverage [email or web address] Password [email or web address] 11/10/2018, 4:58 PM

## 2018-11-10 NOTE — Plan of Care (Signed)

## 2018-11-10 NOTE — Progress Notes (Signed)
BP 174/105, HR 109; pt has hx of HTN; sched norvasc PO given; wil continue to monitor pt;

## 2018-11-10 NOTE — Progress Notes (Signed)
Patient arrived from [facility] via stretcher escorted by [facility]; pt is AOx4, no signs of distress noted, no c/o pain at this time; VS: BP is 174/105, HR 109, RR 16, 100% in RA; paged MD for admitting orders; will continue to monitor patient

## 2018-11-11 ENCOUNTER — Encounter (HOSPITAL_COMMUNITY): Payer: Self-pay

## 2018-11-11 DIAGNOSIS — R197 Diarrhea, unspecified: Secondary | ICD-10-CM | POA: Diagnosis not present

## 2018-11-11 DIAGNOSIS — R112 Nausea with vomiting, unspecified: Secondary | ICD-10-CM | POA: Diagnosis not present

## 2018-11-11 LAB — VITAMIN B12: Vitamin B-12: 772 pg/mL (ref 180–914)

## 2018-11-11 LAB — IRON AND TIBC
Iron: 160 ug/dL (ref 45–182)
Saturation Ratios: 89 % — ABNORMAL HIGH (ref 17.9–39.5)
TIBC: 180 ug/dL — ABNORMAL LOW (ref 250–450)
UIBC: 20 ug/dL

## 2018-11-11 LAB — BASIC METABOLIC PANEL
Anion gap: 10 (ref 5–15)
BUN: 7 mg/dL (ref 6–20)
CO2: 25 mmol/L (ref 22–32)
Calcium: 9.1 mg/dL (ref 8.9–10.3)
Chloride: 101 mmol/L (ref 98–111)
Creatinine, Ser: 0.75 mg/dL (ref 0.61–1.24)
GFR calc Af Amer: >60 mL/min (ref >60)
GFR calc non Af Amer: >60 mL/min (ref >60)
Glucose, Bld: 123 mg/dL — ABNORMAL HIGH (ref 70–99)
Potassium: 3.6 mmol/L (ref 3.5–5.1)
Sodium: 136 mmol/L (ref 135–145)

## 2018-11-11 LAB — RETICULOCYTES
Immature Retic Fract: 37.2 % — ABNORMAL HIGH (ref 2.3–15.9)
RBC.: 2.46 MIL/uL — ABNORMAL LOW (ref 4.22–5.81)
Retic Count, Absolute: 132.3 10*3/uL (ref 19.0–186.0)
Retic Ct Pct: 5.4 % — ABNORMAL HIGH (ref 0.4–3.1)

## 2018-11-11 LAB — FOLATE: Folate: 13 ng/mL (ref >5.9)

## 2018-11-11 LAB — FERRITIN: Ferritin: 3774 ng/mL — ABNORMAL HIGH (ref 24–336)

## 2018-11-11 LAB — OSMOLALITY: Osmolality: 285 mOsm/kg (ref 275–295)

## 2018-11-11 NOTE — Plan of Care (Signed)
Assessment unchanged. Pt verbalized understanding of dc instructions through teach back. Discharged to front entrance escorted by wife.

## 2018-11-11 NOTE — Discharge Instructions (Signed)
Alcohol Use Disorder °Alcohol use disorder is when your drinking disrupts your daily life. When you have this condition, you drink too much alcohol and you cannot control your drinking. °Alcohol use disorder can cause serious problems with your physical health. It can affect your brain, heart, liver, pancreas, immune system, stomach, and intestines. Alcohol use disorder can increase your risk for certain cancers and cause problems with your mental health, such as depression, anxiety, psychosis, delirium, and dementia. People with this disorder risk hurting themselves and others. °What are the causes? °This condition is caused by drinking too much alcohol over time. It is not caused by drinking too much alcohol only one or two times. Some people with this condition drink alcohol to cope with or escape from negative life events. Others drink to relieve pain or symptoms of mental illness. °What increases the risk? °You are more likely to develop this condition if: °· You have a family history of alcohol use disorder. °· Your culture encourages drinking to the point of intoxication, or makes alcohol easy to get. °· You had a mood or conduct disorder in childhood. °· You have been a victim of abuse. °· You are an adolescent and: °? You have poor grades or difficulties in school. °? Your caregivers do not talk to you about saying no to alcohol, or supervise your activities. °? You are impulsive or you have trouble with self-control. °What are the signs or symptoms? °Symptoms of this condition include: °· Drinking more than you want to. °· Drinking for longer than you want to. °· Trying several times to drink less or to control your drinking. °· Spending a lot of time getting alcohol, drinking, or recovering from drinking. °· Craving alcohol. °· Having problems at work, at school, or at home due to drinking. °· Having problems in relationships due to drinking. °· Drinking when it is dangerous to drink, such as before  driving a car. °· Continuing to drink even though you know you might have a physical or mental problem related to drinking. °· Needing more and more alcohol to get the same effect you want from the alcohol (building up tolerance). °· Having symptoms of withdrawal when you stop drinking. Symptoms of withdrawal include: °? Fatigue. °? Nightmares. °? Trouble sleeping. °? Depression. °? Anxiety. °? Fever. °? Seizures. °? Severe confusion. °? Feeling or seeing things that are not there (hallucinations). °? Tremors. °? Rapid heart rate. °? Rapid breathing. °? High blood pressure. °· Drinking to avoid symptoms of withdrawal. °How is this diagnosed? °This condition is diagnosed with an assessment. Your health care provider may start the assessment by asking three or four questions about your drinking. °Your health care provider may perform a physical exam or do lab tests to see if you have physical problems resulting from alcohol use. She or he may refer you to a mental health professional for evaluation. °How is this treated? °Some people with alcohol use disorder are able to reduce their alcohol use to low-risk levels. Others need to completely quit drinking alcohol. When necessary, mental health professionals with specialized training in substance use treatment can help. Your health care provider can help you decide how severe your alcohol use disorder is and what type of treatment you need. The following forms of treatment are available: °· Detoxification. Detoxification involves quitting drinking and using prescription medicines within the first week to help lessen withdrawal symptoms. This treatment is important for people who have had withdrawal symptoms before and for heavy drinkers   who are likely to have withdrawal symptoms. Alcohol withdrawal can be dangerous, and in severe cases, it can cause death. Detoxification may be provided in a home, community, or primary care setting, or in a hospital or substance use  treatment facility. °· Counseling. This treatment is also called talk therapy. It is provided by substance use treatment counselors. A counselor can address the reasons you use alcohol and suggest ways to keep you from drinking again or to prevent problem drinking. The goals of talk therapy are to: °? Find healthy activities and ways for you to cope with stress. °? Identify and avoid the things that trigger your alcohol use. °? Help you learn how to handle cravings. °· Medicines. Medicines can help treat alcohol use disorder by: °? Decreasing alcohol cravings. °? Decreasing the positive feeling you have when you drink alcohol. °? Causing an uncomfortable physical reaction when you drink alcohol (aversion therapy). °· Support groups. Support groups are led by people who have quit drinking. They provide emotional support, advice, and guidance. °These forms of treatment are often combined. Some people with this condition benefit from a combination of treatments provided by specialized substance use treatment centers. °Follow these instructions at home: °· Take over-the-counter and prescription medicines only as told by your health care provider. °· Check with your health care provider before starting any new medicines. °· Ask friends and family members not to offer you alcohol. °· Avoid situations where alcohol is served, including gatherings where others are drinking alcohol. °· Create a plan for what to do when you are tempted to use alcohol. °· Find hobbies or activities that you enjoy that do not include alcohol. °· Keep all follow-up visits as told by your health care provider. This is important. °How is this prevented? °· If you drink, limit alcohol intake to no more than 1 drink a day for nonpregnant women and 2 drinks a day for men. One drink equals 12 oz of beer, 5 oz of wine, or 1½ oz of hard liquor. °· If you have a mental health condition, get treatment and support. °· Do not give alcohol to  adolescents. °· If you are an adolescent: °? Do not drink alcohol. °? Do not be afraid to say no if someone offers you alcohol. Speak up about why you do not want to drink. You can be a positive role model for your friends and set a good example for those around you by not drinking alcohol. °? If your friends drink, spend time with others who do not drink alcohol. Make new friends who do not use alcohol. °? Find healthy ways to manage stress and emotions, such as meditation or deep breathing, exercise, spending time in nature, listening to music, or talking with a trusted friend or family member. °Contact a health care provider if: °· You are not able to take your medicines as told. °· Your symptoms get worse. °· You return to drinking alcohol (relapse) and your symptoms get worse. °Get help right away if: °· You have thoughts about hurting yourself or others. °If you ever feel like you may hurt yourself or others, or have thoughts about taking your own life, get help right away. You can go to your nearest emergency department or call: °· Your local emergency services (911 in the [place] °· A suicide crisis helpline, such as the National Suicide Prevention Lifeline at 1-800-273-8255. This is open 24 hours a day. °Summary °· Alcohol use disorder is when your drinking disrupts your daily   life. When you have this condition, you drink too much alcohol and you cannot control your drinking.  Treatment may include detoxification, counseling, medicine, and support groups.  Ask friends and family members not to offer you alcohol. Avoid situations where alcohol is served.  Get help right away if you have thoughts about hurting yourself or others. This information is not intended to replace advice given to you by your health care provider. Make sure you discuss any questions you have with your health care provider. Document Released: 03/04/2004 Document Revised: 01/07/2017 Document Reviewed: 10/23/2015 Elsevier Patient  Education  2020 [employer]. Hyponatremia Hyponatremia is when the amount of salt (sodium) in your blood is too low. When salt levels are low, your body may take in extra water. This can cause swelling throughout the body. The swelling often affects the brain. What are the causes? This condition may be caused by:  Certain medical problems or conditions.  Vomiting a lot.  Having watery poop (diarrhea) often.  Certain medicines or illegal drugs.  Not having enough water in the body (dehydration).  Drinking too much water.  Eating a diet that is low in salt.  Large burns on your body.  Too much sweating. What increases the risk? You are more likely to get this condition if you:  Have long-term (chronic) kidney disease.  Have heart failure.  Have a medical condition that causes you to have watery poop often.  Do very hard exercises.  Take medicines that affect the amount of salt is in your blood. What are the signs or symptoms? Symptoms of this condition include:  Headache.  Feeling like you may vomit (nausea).  Vomiting.  Being very tired (lethargic).  Muscle weakness and cramps.  Not wanting to eat as much as normal (loss of appetite).  Feeling weak or light-headed. Severe symptoms of this condition include:  Confusion.  Feeling restless (agitation).  Having a fast heart rate.  Passing out (fainting).  Seizures.  Coma. How is this treated? Treatment for this condition depends on the cause. Treatment may include:  Getting fluids through an IV tube that is put into one of your veins.  Taking medicines to fix the salt levels in your blood. If medicines are causing the problem, your medicines will need to be changed.  Limiting how much water or fluid you take in.  Monitoring in the hospital to watch your symptoms. Follow these instructions at home:   Take over-the-counter and prescription medicines only as told by your doctor. Many medicines  can make this condition worse. Talk with your doctor about any medicines that you are taking.  Eat and drink exactly as you are told by your doctor. ? Eat only the foods you are told to eat. ? Limit how much fluid you take.  Do not drink alcohol.  Keep all follow-up visits as told by your doctor. This is important. Contact a doctor if:  You feel more like you may vomit.  You feel more tired.  Your headache gets worse.  You feel more confused.  You feel weaker.  Your symptoms go away and then they come back.  You have trouble following the diet instructions. Get help right away if:  You have a seizure.  You pass out.  You keep having watery poop.  You keep vomiting. Summary  Hyponatremia is when the amount of salt in your blood is too low.  When salt levels are low, you can have swelling throughout the body. The swelling mostly  affects the brain.  Treatment depends on the cause. Treatment may include getting IV fluids, medicines, or not drinking as much fluid. This information is not intended to replace advice given to you by your health care provider. Make sure you discuss any questions you have with your health care provider. Document Released: 10/07/2010 Document Revised: 04/13/2018 Document Reviewed: 12/29/2017 Elsevier Patient Education  2020 [employer].

## 2018-11-11 NOTE — Discharge Summary (Signed)
Physician Discharge Summary  Miguel Harrison KXF:[id number] DOB: February 16, 1970 DOA: 11/09/2018  PCP: Shelda Pal, DO  Admit date: 11/09/2018 Discharge date: 11/11/2018  Admitted From: Home Disposition: Home  Recommendations for Outpatient Follow-up:  1. Follow up with PCP in 1 week 2. Continue encourage EtOH cessation, may benefit from outpatient substance abuse counseling  Home Health: No Equipment/Devices: None  Discharge Condition: Stable CODE STATUS: Full code Diet recommendation: Heart Healthy   History of present illness:  Miguel Middletonis a 48 y.o.malewith medical history significant ofhypertension, GERD, gout, sickle cell trait, alcohol abuse, tobacco abuse, who presents with nausea, vomiting, diarrhea. Patient states that he has been having nausea, vomiting, diarrhea intermittently for about 1 month. He states that he vomited twice in the morning today, with nonbilious nonbloody vomitus. He did not have diarrhea today. Denies abdominal pain. No fever or chills. Patient does not have chest pain, shortness of breath, cough, symptoms of UTI or unilateral weakness.  ED Course:pt was found to have sodium 122, WBC 4.8, lipase 65, pending COVID-19 test, negative urinalysis, abnormal liver function (ALP 108, AST 245, ALT 146, total bilirubin 2.0), renal function okay. Temperature normal, blood pressure 174/105, tachycardia, oxygen saturation 100% on room air. Ultrasound-RUQshowedsludge in the gallbladder, but noevidence of acute cholecystitis.Pt is placed on med-surg bed for obs.  Hospital course:  Nausea vomiting and diarrhea: Resolved Etiology is not clear. Patient has abnormal liver function, which is likely due to alcohol abuse. Lipase 65. Abdominal ultrasound showed sludge in gallbladder, no evidence of cholecystitis. Patient denies abdominal pain to me.No fever orleukocytosis.  Now resolved and tolerating diet without issue.  Likely secondary to  his underlying alcohol abuse with hyponatremia.  Hyponatremia: Resolved Etiologylikely due to GI loss, poor oral intake,dehydration, potomaniadue to alcohol abuse. His sodium is 122 on 18:47 --> 131 at 1:38 which corrected to too fast. He received 1L NS in ED. urine osmolality 95, urine sodium less than 10.  TSH 1.015.    Normal saline was discontinued in favor of D5W, due to significant rise in his sodium level.  He remained asymptomatic; without neurological deficit.  Sodium level improved to 136 at time of discharge.  Encourage complete alcohol cessation.  HTN: Continue home amlodipine-valsartan   Chronic gout involving toe without tophus: Continue allopurinol  Normocytic anemia: Hemoglobin 9.5 on admission, was previously 13.3 on 01/19/18.  Iron level 160, TIBC 180, ferritin 3774, folate 13.0, B12 772, reticulocyte count 5.4.  Recommend further outpatient follow-up.  Repeat CBC in 1 week.  Elevated LFTs:  Etiology likely due to alcohol abuse.Lipase 65. Abdominal ultrasound showed sludge in gallbladder, no evidence of cholecystitis.  Acute hepatitis panel negative.  Continue to discuss complete EtOH cessation.  Tobacco abuse and Alcohol abuse: Counseled on need for complete tobacco and alcohol cessation.  No signs of alcohol withdrawal during the hospitalization.  GERD: Protonix  Discharge Diagnoses:  Active Problems:   Essential hypertension   Chronic gout involving toe without tophus   Normocytic anemia   Elevated LFTs   Tobacco abuse   Alcohol abuse   GERD (gastroesophageal reflux disease)    Discharge Instructions  Discharge Instructions    Call MD for:  difficulty breathing, headache or visual disturbances   Complete by: As directed    Call MD for:  extreme fatigue   Complete by: As directed    Call MD for:  persistant dizziness or light-headedness   Complete by: As directed    Call MD for:  persistant nausea and vomiting  Complete by: As directed     Call MD for:  severe uncontrolled pain   Complete by: As directed    Call MD for:  temperature >100.4   Complete by: As directed    Diet - low sodium heart healthy   Complete by: As directed    Discharge instructions   Complete by: As directed    Avoid alcohol   Increase activity slowly   Complete by: As directed      Allergies as of 11/11/2018   No Known Allergies     Medication List    TAKE these medications   allopurinol 100 MG tablet Commonly known as: ZYLOPRIM Take 1 tablet (100 mg total) by mouth daily.   amLODipine-valsartan 10-320 MG tablet Commonly known as: Exforge Take 1 tablet by mouth daily.   pantoprazole 40 MG tablet Commonly known as: PROTONIX Take 1 tablet (40 mg total) by mouth daily.      Follow-up Information    [name], DO. Schedule an appointment as soon as possible for a visit in 1 week(s).   Specialty: Family Medicine Contact information: [address] [place] [address] [phone]          No Known Allergies  Consultations:  none   Procedures/Studies: US Abdomen Limited Ruq  Result Date: 11/09/2018 CLINICAL DATA:  Nausea and vomiting for 1 month EXAM: ULTRASOUND ABDOMEN LIMITED RIGHT UPPER QUADRANT COMPARISON:  None. FINDINGS: Gallbladder: Sludge is noted in the gallbladder. Gallbladder wall is normal. There is no pericholecystic fluid. The ultrasound technologist reports no sonographic Murphy sign. Common bile duct: Diameter: 2.2 mm Liver: There is heterogeneous increased echotexture of the liver. These findings limits evaluation for focal liver lesion. Portal vein is patent on color Doppler imaging with normal direction of blood flow towards the liver. Other: None. IMPRESSION: Sludge in the gallbladder.  No evidence of acute cholecystitis. Heterogeneous increased echotexture of the liver. This is nonspecific. This can be seen in fatty infiltration of liver. Electronically Signed   By: Sherian Rein  M.D.   On: 11/09/2018 20:32      Subjective: Patient seen and examined at bedside, resting comfortably.  Requesting to be discharged home this morning.  No complaints.  Discussed with him once again extensively need to completely sensate from all alcohol use, he has understanding.  Denies headache, no visual changes, no chest pain, no palpitations, no nausea vomiting/diarrhea, no abdominal pain, no shortness of breath, no weakness, no fatigue, no paresthesias.  No acute events overnight per nurse staff.   Discharge Exam: Vitals:   11/11/18 0640 11/11/18 0903  BP: (!) 177/97 (!) 156/84  Pulse: 80 92  Resp: (!) 22 18  Temp: 98.7 F (37.1 C) 98.7 F (37.1 C)  SpO2: 100% 100%   Vitals:   11/10/18 1949 11/10/18 2221 11/11/18 0640 11/11/18 0903  BP: (!) 158/80 (!) 151/96 (!) 177/97 (!) 156/84  Pulse: 93 83 80 92  Resp:  (!) 24 (!) 22 18  Temp:  98.7 F (37.1 C) 98.7 F (37.1 C) 98.7 F (37.1 C)  TempSrc:  Oral Oral Oral  SpO2:  100% 100% 100%  Weight:        General: Pt is alert, awake, not in acute distress Cardiovascular: RRR, S1/S2 +, no rubs, no gallops Respiratory: CTA bilaterally, no wheezing, no rhonchi Abdominal: Soft, NT, ND, bowel sounds + Extremities: no edema, no cyanosis    The results of significant diagnostics from this hospitalization (including imaging,  microbiology, ancillary and laboratory) are listed below for reference.     Microbiology: Recent Results (from the past 240 hour(s))  SARS CORONAVIRUS 2 (TAT 6-24 HRS) Nasopharyngeal Nasopharyngeal Swab     Status: None   Collection Time: 11/09/18  9:15 PM   Specimen: Nasopharyngeal Swab  Result Value Ref Range Status   SARS Coronavirus 2 NEGATIVE NEGATIVE Final    Comment: (NOTE) SARS-CoV-2 target nucleic acids are NOT DETECTED. The SARS-CoV-2 RNA is generally detectable in upper and lower respiratory specimens during the acute phase of infection. Negative results do not preclude SARS-CoV-2 infection,  do not rule out co-infections with other pathogens, and should not be used as the sole basis for treatment or other patient management decisions. Negative results must be combined with clinical observations, patient history, and epidemiological information. The expected result is Negative. Fact Sheet for Patients: HairSlick.nohttps://www.fda.gov/media/138098/download Fact Sheet for Healthcare Providers: quierodirigir.comhttps://www.fda.gov/media/138095/download This test is not yet approved or cleared by the [place] FDA and  has been authorized for detection and/or diagnosis of SARS-CoV-2 by FDA under an Emergency Use Authorization (EUA). This EUA will remain  in effect (meaning this test can be used) for the duration of the COVID-19 declaration under Section 56 4(b)(1) of the Act, 21 U.S.C. section 360bbb-3(b)(1), unless the authorization is terminated or revoked sooner. Performed at [facility], [address] [place], [place] [address]      Labs: BNP (last 3 results) No results for input(s): BNP in the last 8760 hours. Basic Metabolic Panel: Recent Labs  Lab 11/10/18 0138 11/10/18 0659 11/10/18 1110 11/10/18 1654 11/11/18 0348  NA 132*  131* 133* 132* 134* 136  K 4.1  4.1 4.6 4.5 3.9 3.6  CL 98  98 99 100 98 101  CO2 22  20* 25 20* 25 25  GLUCOSE 114*  115* 107* 122* 77 123*  BUN 5*  5* 6 6 7 7   CREATININE 0.83  0.79 0.87 0.89 0.84 0.75  CALCIUM 8.9  8.8* 8.7* 8.7* 9.1 9.1   Liver Function Tests: Recent Labs  Lab 11/09/18 1847 11/10/18 0138  AST 245* 200*  ALT 146* 125*  ALKPHOS 108 102  BILITOT 2.0* 1.8*  PROT 7.9 6.8  ALBUMIN 3.8 3.3*   Recent Labs  Lab 11/09/18 1847  LIPASE 65*   No results for input(s): AMMONIA in the last 168 hours. CBC: Recent Labs  Lab 11/09/18 1847 11/10/18 0138  WBC 4.8 4.3  NEUTROABS 2.6  --   HGB 9.5* 8.9*  HCT 26.0* 24.1*  MCV 97.0 98.0  PLT 124* 117*   Cardiac Enzymes: No results for input(s): CKTOTAL, CKMB,  CKMBINDEX, TROPONINI in the last 168 hours. BNP: Invalid input(s): POCBNP CBG: No results for input(s): GLUCAP in the last 168 hours. D-Dimer No results for input(s): DDIMER in the last 72 hours. Hgb A1c No results for input(s): HGBA1C in the last 72 hours. Lipid Profile No results for input(s): CHOL, HDL, LDLCALC, TRIG, CHOLHDL, LDLDIRECT in the last 72 hours. Thyroid function studies Recent Labs    11/10/18 0138  TSH 1.015   Anemia work up Recent Labs    11/11/18 0348  VITAMINB12 772  FOLATE 13.0  FERRITIN 3,774*  TIBC 180*  IRON 160  RETICCTPCT 5.4*   Urinalysis    Component Value Date/Time   COLORURINE YELLOW 11/09/2018 1847   APPEARANCEUR CLEAR 11/09/2018 1847   LABSPEC <1.005 (L) 11/09/2018 1847   PHURINE 6.0 11/09/2018 1847   GLUCOSEU NEGATIVE 11/09/2018 1847  HGBUR NEGATIVE 11/09/2018 1847   BILIRUBINUR NEGATIVE 11/09/2018 1847   KETONESUR NEGATIVE 11/09/2018 1847   PROTEINUR NEGATIVE 11/09/2018 1847   NITRITE NEGATIVE 11/09/2018 1847   LEUKOCYTESUR NEGATIVE 11/09/2018 1847   Sepsis Labs Invalid input(s): PROCALCITONIN,  WBC,  LACTICIDVEN Microbiology Recent Results (from the past 240 hour(s))  SARS CORONAVIRUS 2 (TAT 6-24 HRS) Nasopharyngeal Nasopharyngeal Swab     Status: None   Collection Time: 11/09/18  9:15 PM   Specimen: Nasopharyngeal Swab  Result Value Ref Range Status   SARS Coronavirus 2 NEGATIVE NEGATIVE Final    Comment: (NOTE) SARS-CoV-2 target nucleic acids are NOT DETECTED. The SARS-CoV-2 RNA is generally detectable in upper and lower respiratory specimens during the acute phase of infection. Negative results do not preclude SARS-CoV-2 infection, do not rule out co-infections with other pathogens, and should not be used as the sole basis for treatment or other patient management decisions. Negative results must be combined with clinical observations, patient history, and epidemiological information. The expected result is  Negative. Fact Sheet for Patients: HairSlick.no Fact Sheet for Healthcare Providers: quierodirigir.com This test is not yet approved or cleared by the [place] FDA and  has been authorized for detection and/or diagnosis of SARS-CoV-2 by FDA under an Emergency Use Authorization (EUA). This EUA will remain  in effect (meaning this test can be used) for the duration of the COVID-19 declaration under Section 56 4(b)(1) of the Act, 21 U.S.C. section 360bbb-3(b)(1), unless the authorization is terminated or revoked sooner. Performed at [facility], [address] [place], [place] [address]      Time coordinating discharge: Over 30 minutes  SIGNED:   Alvira Philips [place], DO  [facility] 11/11/2018, 9:15 AM

## 2018-11-13 ENCOUNTER — Telehealth: Payer: Self-pay | Admitting: *Deleted

## 2018-11-13 NOTE — Telephone Encounter (Signed)
Transition Care Management Follow-up Telephone Call   Date discharged?11/11/18   How have you been since you were released from the hospital? "Doing okay" pt reports he is returning to work today.    Do you understand why you were in the hospital? yes   Do you understand the discharge instructions? yes   Where were you discharged to? Home with wife   Items Reviewed:  Medications reviewed: Pt reports no changes  Allergies reviewed: yes  Dietary changes reviewed: yes  Referrals reviewed: yes   Functional Questionnaire:   Activities of Daily Living (ADLs):   He states they are independent in the following: ambulation, bathing and hygiene, feeding, continence, grooming, toileting and dressing States they require assistance with the following: na   Any transportation issues/concerns?: no   Any patient concerns? no   Confirmed importance and date/time of follow-up visits scheduled yes  Provider Appointment booked with PCP 11/21/18  Confirmed with patient if condition begins to worsen call PCP or go to the ER.  Patient was given the office number and encouraged to call back with question or concerns.  : yes

## 2018-11-21 ENCOUNTER — Other Ambulatory Visit: Payer: Self-pay | Admitting: Family Medicine

## 2018-11-21 ENCOUNTER — Encounter: Payer: Self-pay | Admitting: Family Medicine

## 2018-11-21 ENCOUNTER — Other Ambulatory Visit: Payer: Self-pay

## 2018-11-21 ENCOUNTER — Ambulatory Visit (INDEPENDENT_AMBULATORY_CARE_PROVIDER_SITE_OTHER): Payer: Managed Care, Other (non HMO) | Admitting: Family Medicine

## 2018-11-21 VITALS — BP 120/72 | HR 105 | Temp 97.4°F | Ht 70.0 in | Wt 196.5 lb

## 2018-11-21 DIAGNOSIS — D539 Nutritional anemia, unspecified: Secondary | ICD-10-CM

## 2018-11-21 DIAGNOSIS — F1010 Alcohol abuse, uncomplicated: Secondary | ICD-10-CM | POA: Diagnosis not present

## 2018-11-21 DIAGNOSIS — R7401 Elevation of levels of liver transaminase levels: Secondary | ICD-10-CM

## 2018-11-21 DIAGNOSIS — E871 Hypo-osmolality and hyponatremia: Secondary | ICD-10-CM

## 2018-11-21 LAB — CBC
HCT: 31.8 % — ABNORMAL LOW (ref 39.0–52.0)
Hemoglobin: 10.7 g/dL — ABNORMAL LOW (ref 13.0–17.0)
MCHC: 33.5 g/dL (ref 30.0–36.0)
MCV: 107 fl — ABNORMAL HIGH (ref 78.0–100.0)
Platelets: 190 10*3/uL (ref 150.0–400.0)
RBC: 2.97 Mil/uL — ABNORMAL LOW (ref 4.22–5.81)
RDW: 14.6 % (ref 11.5–15.5)
WBC: 7.3 10*3/uL (ref 4.0–10.5)

## 2018-11-21 LAB — COMPREHENSIVE METABOLIC PANEL
ALT: 139 U/L — ABNORMAL HIGH (ref 0–53)
AST: 156 U/L — ABNORMAL HIGH (ref 0–37)
Albumin: 4.1 g/dL (ref 3.5–5.2)
Alkaline Phosphatase: 85 U/L (ref 39–117)
BUN: 15 mg/dL (ref 6–23)
CO2: 28 mEq/L (ref 19–32)
Calcium: 9.8 mg/dL (ref 8.4–10.5)
Chloride: 102 mEq/L (ref 96–112)
Creatinine, Ser: 1.25 mg/dL (ref 0.40–1.50)
GFR: 73.85 mL/min (ref >60.00)
Glucose, Bld: 99 mg/dL (ref 70–99)
Potassium: 4.2 mEq/L (ref 3.5–5.1)
Sodium: 140 mEq/L (ref 135–145)
Total Bilirubin: 1.2 mg/dL (ref 0.2–1.2)
Total Protein: 7.1 g/dL (ref 6.0–8.3)

## 2018-11-21 NOTE — Patient Instructions (Addendum)
Give [place] 2-3 business days to get the results of your labs back.   Keep the diet clean and stay active.  Cut down to 12 oz beers. I would move from 40 oz daily to 24 oz daily.   AA is a good option if available.   Let us know if you need anything.

## 2018-11-21 NOTE — Progress Notes (Signed)
Chief Complaint  Patient presents with  . Hospitalization Follow-up    HPI Miguel Harrison is a 48 y.o. y.o. male who presents for a transition of care visit.  Pt was discharged from [facility] on 11/11/2018.  Within 48 business hours of discharge our office contacted pt via telephone to coordinate care and needs.   Patient was admitted to [facility] on 11/09/2018 and discharged on 11/11/2018.  He was having nausea, vomiting, and was also found to have a sodium of 122.  He was slowly rehydrated.  2 weeks leading up to hospitalization, he had been drinking heavily.  He normally drinks 40 ounce beers 3 times daily.  He had substantially increased this amount over this time and cites having a little extra money as to why this change happened.  He denies any mood issues.  He has received many calls of support from family members and friends since getting out of the hospital.  He has cut down to 40 ounces of beer daily.  He tried to attend AA meetings but it is online and not the same for him.  He is not currently interested in an outpatient rehab program.  He does know that he likely needs to stop drinking.  His appetite has returned and he feels much better.  He is currently taking a multivitamin.  Past Medical History:  Diagnosis Date  . Gout   . Hypertension   . Sickle cell trait (HCC)      Family History  Problem Relation Age of Onset  . Hypertension Mother   . Colon cancer Neg Hx   . Colon polyps Neg Hx   . Esophageal cancer Neg Hx   . Rectal cancer Neg Hx   . Stomach cancer Neg Hx    Allergies as of 11/21/2018   No Known Allergies     Medication List       Accurate as of November 21, 2018  9:03 AM. If you have any questions, ask your nurse or doctor.        allopurinol 100 MG tablet Commonly known as: ZYLOPRIM Take 1 tablet (100 mg total) by mouth daily.   amLODipine-valsartan 10-320 MG tablet Commonly known as: Exforge Take 1 tablet by mouth daily.   pantoprazole 40  MG tablet Commonly known as: PROTONIX Take 1 tablet (40 mg total) by mouth daily.       ROS:  Constitutional: No fevers or chills, no weight loss HEENT: No headaches, hearing loss, or runny nose, no sore throat Heart: No chest pain Lungs: No SOB, no cough Abd: No bowel changes, no pain, no N/V GU: No urinary complaints Neuro: No numbness, tingling or weakness Msk: No joint or muscle pain  Objective BP 120/72 (BP Location: Left Arm, Patient Position: Sitting, Cuff Size: Large)   Pulse (!) 105   Temp (!) 97.4 F (36.3 C) (Temporal)   Ht 5\' 10"  (1.778 m)   Wt 196 lb 8 oz (89.1 kg)   SpO2 93%   BMI 28.19 kg/m  General Appearance:  awake, alert, oriented, in no acute distress and well developed, well nourished Skin:  there are no suspicious lesions or rashes of concern Head/face:  NCAT Eyes:  EOMI, PERRLA Ears:  canals and TMs NI Nose/Sinuses:  negative Mouth/Throat:  Mucosa moist, no lesions; pharynx without erythema, edema or exudate. Neck:  neck- supple, no mass, non-tender and no jvd Lungs: Clear to auscultation.  No rales, rhonchi, or wheezing. Normal effort, no accessory muscle use. Heart:  Heart sounds are normal.  Regular rate and rhythm without murmur, gallop or rub. No bruits. Abdomen:  BS+, soft, NT, ND, no masses or organomegaly Musculoskeletal:  No muscle group atrophy or asymmetry, gait normal Neurologic:  Alert and oriented x 3, gait normal., reflexes normal and symmetric, strength and  sensation grossly normal Psych exam: Nml mood and affect, age appropriate judgment and insight  Transaminitis - Plan: Comprehensive metabolic panel  Anemia, macrocytic - Plan: CBC  Hyponatremia  Alcohol abuse  Discharge summary and medication list have been reviewed/reconciled.  Labs pending at the time of discharge have been reviewed or are still pending at the time of this visit.  Follow-up labs and appointments have been ordered and/or coordinated appropriately.   Counseled on alcohol cessation.  He likely needs to stop.  Offered to set him up with outpatient rehab.  He is not having any withdrawal symptoms and is steadily cutting down.  Recommending going from 40 ounces to 24 ounces daily.  If he changes his mind, he will reach out to [place]. We will follow-up on inpatient labs.  TRANSITIONAL CARE MANAGEMENT CERTIFICATION:  I certify the following are true:   1. Communication with the patient/care giver was made within 2 business days of discharge.  2. Complexity of Medical decision making is moderate.  3. Face to face visit occurred within 14 days of discharge.   F/u as originally scheduled. The patient voiced understanding and agreement to the plan.  [name] [place], DO 11/21/18 9:03 AM

## 2018-12-19 ENCOUNTER — Other Ambulatory Visit: Payer: Managed Care, Other (non HMO)

## 2018-12-26 ENCOUNTER — Other Ambulatory Visit: Payer: Managed Care, Other (non HMO)

## 2019-01-02 ENCOUNTER — Other Ambulatory Visit: Payer: Managed Care, Other (non HMO)

## 2019-01-11 ENCOUNTER — Other Ambulatory Visit: Payer: Managed Care, Other (non HMO)

## 2019-01-12 ENCOUNTER — Other Ambulatory Visit: Payer: Self-pay

## 2019-01-12 ENCOUNTER — Other Ambulatory Visit (INDEPENDENT_AMBULATORY_CARE_PROVIDER_SITE_OTHER): Payer: Managed Care, Other (non HMO)

## 2019-01-12 DIAGNOSIS — R7401 Elevation of levels of liver transaminase levels: Secondary | ICD-10-CM | POA: Diagnosis not present

## 2019-01-12 NOTE — Addendum Note (Signed)
Addended by: Caffie Pinto on: 01/12/2019 02:23 PM   Modules accepted: Orders

## 2019-01-12 NOTE — Addendum Note (Signed)
Addended by: Sheana Bir M on: 01/12/2019 02:23 PM   Modules accepted: Orders  

## 2019-01-13 LAB — COMPREHENSIVE METABOLIC PANEL
AG Ratio: 1.3 (calc) (ref 1.0–2.5)
ALT: 26 U/L (ref 9–46)
AST: 29 U/L (ref 10–35)
Albumin: 4 g/dL (ref 3.6–5.1)
Alkaline phosphatase (APISO): 62 U/L (ref 35–144)
BUN: 15 mg/dL (ref 7–25)
CO2: 21 mmol/L (ref 20–32)
Calcium: 9 mg/dL (ref 8.6–10.3)
Chloride: 103 mmol/L (ref 98–110)
Creat: 1.05 mg/dL (ref 0.70–1.33)
Globulin: 3 g/dL (calc) (ref 1.9–3.7)
Glucose, Bld: 78 mg/dL (ref 65–99)
Potassium: 4.1 mmol/L (ref 3.5–5.3)
Sodium: 138 mmol/L (ref 135–146)
Total Bilirubin: 0.7 mg/dL (ref 0.2–1.2)
Total Protein: 7 g/dL (ref 6.1–8.1)

## 2019-01-13 LAB — CBC
HCT: 39.4 % (ref 38.5–50.0)
Hemoglobin: 13.1 g/dL — ABNORMAL LOW (ref 13.2–17.1)
MCH: 33.6 pg — ABNORMAL HIGH (ref 27.0–33.0)
MCHC: 33.2 g/dL (ref 32.0–36.0)
MCV: 101 fL — ABNORMAL HIGH (ref 80.0–100.0)
MPV: 14.3 fL — ABNORMAL HIGH (ref 7.5–12.5)
Platelets: 124 10*3/uL — ABNORMAL LOW (ref 140–400)
RBC: 3.9 10*6/uL — ABNORMAL LOW (ref 4.20–5.80)
RDW: 11.1 % (ref 11.0–15.0)
WBC: 7 10*3/uL (ref 3.8–10.8)

## 2019-01-15 ENCOUNTER — Telehealth: Payer: Self-pay | Admitting: Family Medicine

## 2019-01-15 NOTE — Telephone Encounter (Signed)
Patient is calling back for Robin. For his lab results. Please advise [phone]

## 2019-01-15 NOTE — Telephone Encounter (Signed)
See result notes. 

## 2019-01-16 ENCOUNTER — Other Ambulatory Visit: Payer: Self-pay | Admitting: Family Medicine

## 2019-01-16 DIAGNOSIS — D539 Nutritional anemia, unspecified: Secondary | ICD-10-CM

## 2019-01-16 DIAGNOSIS — R7401 Elevation of levels of liver transaminase levels: Secondary | ICD-10-CM

## 2019-01-30 ENCOUNTER — Encounter: Payer: Managed Care, Other (non HMO) | Admitting: Family Medicine

## 2019-02-26 ENCOUNTER — Other Ambulatory Visit: Payer: Self-pay

## 2019-02-27 ENCOUNTER — Encounter: Payer: Managed Care, Other (non HMO) | Admitting: Family Medicine

## 2019-02-27 ENCOUNTER — Other Ambulatory Visit: Payer: Managed Care, Other (non HMO)

## 2019-04-20 ENCOUNTER — Ambulatory Visit: Payer: Managed Care, Other (non HMO) | Attending: Internal Medicine

## 2019-04-20 DIAGNOSIS — Z20822 Contact with and (suspected) exposure to covid-19: Secondary | ICD-10-CM

## 2019-04-21 LAB — NOVEL CORONAVIRUS, NAA: SARS-CoV-2, NAA: NOT DETECTED

## 2019-05-19 ENCOUNTER — Ambulatory Visit: Payer: Managed Care, Other (non HMO) | Attending: Internal Medicine

## 2019-05-19 DIAGNOSIS — Z23 Encounter for immunization: Secondary | ICD-10-CM

## 2019-05-19 NOTE — Progress Notes (Signed)
   Covid-19 Vaccination Clinic  Name:  Miguel Harrison    MRN: [id number] DOB: Apr 06, 1970  05/19/2019  Miguel Harrison was observed post Covid-19 immunization for 15 minutes without incident. He was provided with Vaccine Information Sheet and instruction to access the V-Safe system.   Miguel Harrison was instructed to call 911 with any severe reactions post vaccine: Marland Kitchen Difficulty breathing  . Swelling of face and throat  . A fast heartbeat  . A bad rash all over body  . Dizziness and weakness   Immunizations Administered    Name Date Dose VIS Date Route   Pfizer COVID-19 Vaccine 05/19/2019  8:26 AM 0.3 mL 01/19/2019 Intramuscular   Manufacturer: [employer], [employer]   Lot: [id number]   NDC: [address]-3

## 2019-06-12 ENCOUNTER — Ambulatory Visit: Payer: Managed Care, Other (non HMO) | Attending: Internal Medicine

## 2019-06-12 DIAGNOSIS — Z23 Encounter for immunization: Secondary | ICD-10-CM

## 2019-06-12 NOTE — Progress Notes (Signed)
   Covid-19 Vaccination Clinic  Name:  Miguel Harrison    MRN: [id number] DOB: Aug 31, 1970  06/12/2019  [name] was observed post Covid-19 immunization for 15 minutes without incident. He was provided with Vaccine Information Sheet and instruction to access the V-Safe system.   [name] was instructed to call 911 with any severe reactions post vaccine: Marland Kitchen Difficulty breathing  . Swelling of face and throat  . A fast heartbeat  . A bad rash all over body  . Dizziness and weakness   Immunizations Administered    Name Date Dose VIS Date Route   Pfizer COVID-19 Vaccine 06/12/2019  8:39 AM 0.3 mL 04/04/2018 Intramuscular   Manufacturer: [employer], [employer]   Lot: [id number]   NDC: [address]-3

## 2019-08-01 ENCOUNTER — Other Ambulatory Visit: Payer: Self-pay | Admitting: Family Medicine

## 2019-08-06 ENCOUNTER — Emergency Department (HOSPITAL_BASED_OUTPATIENT_CLINIC_OR_DEPARTMENT_OTHER)
Admission: EM | Admit: 2019-08-06 | Discharge: 2019-08-06 | Disposition: A | Discharge status: Home / Self Care | Payer: Managed Care, Other (non HMO) | Attending: Emergency Medicine | Admitting: Emergency Medicine

## 2019-08-06 ENCOUNTER — Emergency Department (HOSPITAL_BASED_OUTPATIENT_CLINIC_OR_DEPARTMENT_OTHER): Payer: Managed Care, Other (non HMO)

## 2019-08-06 ENCOUNTER — Encounter (HOSPITAL_BASED_OUTPATIENT_CLINIC_OR_DEPARTMENT_OTHER): Payer: Self-pay | Admitting: *Deleted

## 2019-08-06 ENCOUNTER — Other Ambulatory Visit: Payer: Self-pay

## 2019-08-06 DIAGNOSIS — I10 Essential (primary) hypertension: Secondary | ICD-10-CM | POA: Diagnosis not present

## 2019-08-06 DIAGNOSIS — F17290 Nicotine dependence, other tobacco product, uncomplicated: Secondary | ICD-10-CM | POA: Diagnosis not present

## 2019-08-06 DIAGNOSIS — Y999 Unspecified external cause status: Secondary | ICD-10-CM | POA: Insufficient documentation

## 2019-08-06 DIAGNOSIS — S299XXA Unspecified injury of thorax, initial encounter: Secondary | ICD-10-CM | POA: Diagnosis present

## 2019-08-06 DIAGNOSIS — Y9289 Other specified places as the place of occurrence of the external cause: Secondary | ICD-10-CM | POA: Insufficient documentation

## 2019-08-06 DIAGNOSIS — Z79899 Other long term (current) drug therapy: Secondary | ICD-10-CM | POA: Insufficient documentation

## 2019-08-06 DIAGNOSIS — Y9389 Activity, other specified: Secondary | ICD-10-CM | POA: Diagnosis not present

## 2019-08-06 DIAGNOSIS — S0081XA Abrasion of other part of head, initial encounter: Secondary | ICD-10-CM | POA: Insufficient documentation

## 2019-08-06 DIAGNOSIS — S2242XA Multiple fractures of ribs, left side, initial encounter for closed fracture: Secondary | ICD-10-CM | POA: Diagnosis not present

## 2019-08-06 LAB — CBC WITH DIFFERENTIAL/PLATELET
Abs Immature Granulocytes: 0.03 10*3/uL (ref 0.00–0.07)
Basophils Absolute: 0 10*3/uL (ref 0.0–0.1)
Basophils Relative: 0 %
Eosinophils Absolute: 0 10*3/uL (ref 0.0–0.5)
Eosinophils Relative: 0 %
HCT: 42.3 % (ref 39.0–52.0)
Hemoglobin: 15 g/dL (ref 13.0–17.0)
Immature Granulocytes: 0 %
Lymphocytes Relative: 13 %
Lymphs Abs: 1.2 10*3/uL (ref 0.7–4.0)
MCH: 35.2 pg — ABNORMAL HIGH (ref 26.0–34.0)
MCHC: 35.5 g/dL (ref 30.0–36.0)
MCV: 99.3 fL (ref 80.0–100.0)
Monocytes Absolute: 0.9 10*3/uL (ref 0.1–1.0)
Monocytes Relative: 9 %
Neutro Abs: 7.3 10*3/uL (ref 1.7–7.7)
Neutrophils Relative %: 78 %
Platelets: 146 10*3/uL — ABNORMAL LOW (ref 150–400)
RBC: 4.26 MIL/uL (ref 4.22–5.81)
RDW: 11.2 % — ABNORMAL LOW (ref 11.5–15.5)
WBC: 9.4 10*3/uL (ref 4.0–10.5)
nRBC: 0 % (ref 0.0–0.2)

## 2019-08-06 LAB — COMPREHENSIVE METABOLIC PANEL
ALT: 56 U/L — ABNORMAL HIGH (ref 0–44)
AST: 53 U/L — ABNORMAL HIGH (ref 15–41)
Albumin: 4.1 g/dL (ref 3.5–5.0)
Alkaline Phosphatase: 58 U/L (ref 38–126)
Anion gap: 14 (ref 5–15)
BUN: 10 mg/dL (ref 6–20)
CO2: 25 mmol/L (ref 22–32)
Calcium: 9.1 mg/dL (ref 8.9–10.3)
Chloride: 91 mmol/L — ABNORMAL LOW (ref 98–111)
Creatinine, Ser: 0.98 mg/dL (ref 0.61–1.24)
GFR calc Af Amer: >60 mL/min (ref >60)
GFR calc non Af Amer: >60 mL/min (ref >60)
Glucose, Bld: 117 mg/dL — ABNORMAL HIGH (ref 70–99)
Potassium: 4.2 mmol/L (ref 3.5–5.1)
Sodium: 130 mmol/L — ABNORMAL LOW (ref 135–145)
Total Bilirubin: 2.2 mg/dL — ABNORMAL HIGH (ref 0.3–1.2)
Total Protein: 8.5 g/dL — ABNORMAL HIGH (ref 6.5–8.1)

## 2019-08-06 LAB — LIPASE, BLOOD: Lipase: 31 U/L (ref 11–51)

## 2019-08-06 MED ORDER — METHOCARBAMOL 500 MG PO TABS
750.0000 mg | ORAL_TABLET | Freq: Once | ORAL | Status: AC
  Administered 2019-08-06: 750 mg via ORAL
  Filled 2019-08-06: qty 2

## 2019-08-06 MED ORDER — IOHEXOL 300 MG/ML  SOLN
100.0000 mL | Freq: Once | INTRAMUSCULAR | Status: AC | PRN
  Administered 2019-08-06: 75 mL via INTRAVENOUS

## 2019-08-06 MED ORDER — HYDROCODONE-ACETAMINOPHEN 5-325 MG PO TABS: 1.0000 | ORAL_TABLET | ORAL | 0 refills | Status: AC | PRN

## 2019-08-06 MED ORDER — METHOCARBAMOL 500 MG PO TABS
500.0000 mg | ORAL_TABLET | Freq: Two times a day (BID) | ORAL | 0 refills | Status: DC
Start: 2019-08-06 — End: 2019-08-10

## 2019-08-06 MED ORDER — MORPHINE SULFATE (PF) 4 MG/ML IV SOLN
4.0000 mg | Freq: Once | INTRAVENOUS | Status: AC
  Administered 2019-08-06: 4 mg via INTRAVENOUS
  Filled 2019-08-06: qty 1
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
## 2019-08-06 NOTE — ED Notes (Signed)
Pt provided instructions on incentive spirometer.

## 2019-08-06 NOTE — Discharge Instructions (Addendum)
The CT of your chest showed fractures to your second, third, fourth and tenth left anterior ribs.  We provided you with an incentive spirometer, please use this in order to prevent development of pneumonia.  Avoid any suppressants for cough while at home.  I have also prescribed a short course of pain medication to help with symptoms.  You will need to have follow-up of the fracture to your left clavicle.  Dr. Mickle Asper number is attached to your chart.  Follow-up with your primary care physician as needed.  Return to the ED if you experience any worsening symptoms, shortness of breath, chest pain.

## 2019-08-06 NOTE — ED Notes (Signed)
Pt ambulated with SpO2 monitor. Pt maintained SpO2 at 94% with HR of 102 while ambulating. Pt denies ShOB during ambulation.

## 2019-08-06 NOTE — ED Notes (Signed)
Pt to CT

## 2019-08-10 ENCOUNTER — Telehealth: Payer: Self-pay | Admitting: Family Medicine

## 2019-08-10 ENCOUNTER — Ambulatory Visit (INDEPENDENT_AMBULATORY_CARE_PROVIDER_SITE_OTHER): Payer: Managed Care, Other (non HMO) | Admitting: Family Medicine

## 2019-08-10 ENCOUNTER — Encounter: Payer: Self-pay | Admitting: Family Medicine

## 2019-08-10 ENCOUNTER — Other Ambulatory Visit: Payer: Self-pay

## 2019-08-10 VITALS — BP 136/72 | HR 96 | Temp 99.0°F | Ht 70.0 in | Wt 214.0 lb

## 2019-08-10 DIAGNOSIS — S2242XA Multiple fractures of ribs, left side, initial encounter for closed fracture: Secondary | ICD-10-CM | POA: Diagnosis not present

## 2019-08-10 DIAGNOSIS — S42022A Displaced fracture of shaft of left clavicle, initial encounter for closed fracture: Secondary | ICD-10-CM

## 2019-08-10 DIAGNOSIS — I10 Essential (primary) hypertension: Secondary | ICD-10-CM | POA: Diagnosis not present

## 2019-08-10 MED ORDER — HYDROCODONE-ACETAMINOPHEN 5-325 MG PO TABS: 1.0000 | ORAL_TABLET | Freq: Four times a day (QID) | ORAL | 0 refills | Status: DC | PRN

## 2019-08-10 MED ORDER — ALLOPURINOL 100 MG PO TABS: 100.0000 mg | ORAL_TABLET | Freq: Every day | ORAL | 2 refills | Status: DC

## 2019-08-10 MED ORDER — METHOCARBAMOL 500 MG PO TABS: 500.0000 mg | ORAL_TABLET | Freq: Three times a day (TID) | ORAL | 0 refills | Status: DC | PRN

## 2019-08-10 MED ORDER — AMLODIPINE BESYLATE-VALSARTAN 10-320 MG PO TABS: 1.0000 | ORAL_TABLET | Freq: Every day | ORAL | 2 refills | Status: DC

## 2019-08-10 NOTE — Telephone Encounter (Signed)
Patient was seen in today , patient dropped off documents to be completed by [facility] regarding fmla . Documents were left in basket
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
## 2019-08-10 NOTE — Patient Instructions (Addendum)
Ice/cold pack over area for 10-15 min twice daily.  Ibuprofen 400-600 mg (2-3 over the counter strength tabs) every 6 hours as needed for pain.  Do not drink alcohol, do any illicit/street drugs, drive or do anything that requires alertness while on this medicine.   I will write FMLA for 6 weeks. If you need longer, I will defer to your specialist who will have a better knowledge of the recovery process and treatment approach.   Let us know if you need anything.

## 2019-08-14 NOTE — Telephone Encounter (Signed)
PCP completed///have faxed////received fax confirmation///made copy for scan///called the patient As he asked to let know done/can pickup original for his records.

## 2019-08-14 NOTE — Telephone Encounter (Signed)
Spoke to the patient informed faxed and he will pickup original at the front desk

## 2019-08-27 ENCOUNTER — Telehealth: Payer: Self-pay | Admitting: Family Medicine

## 2019-08-27 NOTE — Telephone Encounter (Signed)
Called back and was not able to get through on the line

## 2019-08-27 NOTE — Telephone Encounter (Signed)
Miguel Harrison  Call Back # [phone]  Wynona Canes calling in regards to patient recent diagnosis. She would like a call back

## 2019-08-28 NOTE — Telephone Encounter (Signed)
Called unable to get through on this number 

## 2019-09-03 ENCOUNTER — Telehealth: Payer: Self-pay | Admitting: Family Medicine

## 2019-09-03 NOTE — Telephone Encounter (Signed)
Caller: Armin Call back phone number: [phone]  Wants to check on the status of the short term disability paperwork from [employer].

## 2019-09-04 NOTE — Telephone Encounter (Signed)
Just filled it out today and it is ready to be faxed. Ty.

## 2019-09-04 NOTE — Telephone Encounter (Signed)
Paperwork has been faxed. Patient is aware. 

## 2019-10-02 ENCOUNTER — Other Ambulatory Visit: Payer: Managed Care, Other (non HMO)

## 2019-10-02 DIAGNOSIS — Z20822 Contact with and (suspected) exposure to covid-19: Secondary | ICD-10-CM

## 2019-10-03 LAB — SPECIMEN STATUS REPORT

## 2019-10-03 LAB — SARS-COV-2, NAA 2 DAY TAT

## 2019-10-03 LAB — NOVEL CORONAVIRUS, NAA: SARS-CoV-2, NAA: NOT DETECTED

## 2020-01-29 ENCOUNTER — Other Ambulatory Visit: Payer: Self-pay

## 2020-01-29 DIAGNOSIS — Z20822 Contact with and (suspected) exposure to covid-19: Secondary | ICD-10-CM

## 2020-01-31 LAB — SARS-COV-2, NAA 2 DAY TAT

## 2020-01-31 LAB — NOVEL CORONAVIRUS, NAA: SARS-CoV-2, NAA: NOT DETECTED

## 2020-05-01 ENCOUNTER — Telehealth: Payer: Self-pay | Admitting: Family Medicine

## 2020-05-01 NOTE — Telephone Encounter (Signed)
Medication: allopurinol (ZYLOPRIM) 100 MG tablet [[id number]]      Has the patient contacted their pharmacy?  (If no, request that the patient contact the pharmacy for the refill.) (If yes, when and what did the pharmacy advise?)     Preferred Pharmacy (with phone number or street name): [facility] [phone] [facility] [place] - [address]  [address], [place] [address]  Phone:  [phone] Fax:  [phone]      Agent: Please be advised that RX refills may take up to 3 business days. We ask that you follow-up with your pharmacy.
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
## 2020-06-23 ENCOUNTER — Ambulatory Visit (INDEPENDENT_AMBULATORY_CARE_PROVIDER_SITE_OTHER): Payer: 59 | Admitting: Family Medicine

## 2020-06-23 ENCOUNTER — Encounter: Payer: Self-pay | Admitting: Family Medicine

## 2020-06-23 ENCOUNTER — Other Ambulatory Visit: Payer: Self-pay

## 2020-06-23 VITALS — BP 150/70 | HR 101 | Temp 98.9°F | Ht 70.0 in | Wt 209.0 lb

## 2020-06-23 DIAGNOSIS — M1A9XX0 Chronic gout, unspecified, without tophus (tophi): Secondary | ICD-10-CM

## 2020-06-23 DIAGNOSIS — S01312A Laceration without foreign body of left ear, initial encounter: Secondary | ICD-10-CM | POA: Diagnosis not present

## 2020-06-23 DIAGNOSIS — I10 Essential (primary) hypertension: Secondary | ICD-10-CM

## 2020-06-23 MED ORDER — VALSARTAN 320 MG PO TABS: 320.0000 mg | ORAL_TABLET | Freq: Every day | ORAL | 2 refills | Status: DC

## 2020-06-23 MED ORDER — AMLODIPINE BESYLATE 10 MG PO TABS
10.0000 mg | ORAL_TABLET | Freq: Every day | ORAL | 2 refills | Status: DC
Start: 2020-06-23 — End: 2021-06-26

## 2020-06-23 MED ORDER — ALLOPURINOL 100 MG PO TABS: 100.0000 mg | ORAL_TABLET | Freq: Every day | ORAL | 2 refills | Status: DC

## 2020-06-23 NOTE — Patient Instructions (Addendum)
Keep an eye on your blood pressure at home, let me know if it doesn't come back down.  Keep the diet clean and stay active.  Give [place] 2-3 business days to get the results of your labs back.   If you do not hear anything about your referral in the next few days, call our office and ask for an update.  Let us know if you need anything.
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
## 2020-06-24 ENCOUNTER — Other Ambulatory Visit: Payer: Self-pay | Admitting: Family Medicine

## 2020-06-24 LAB — COMPREHENSIVE METABOLIC PANEL
ALT: 56 U/L — ABNORMAL HIGH (ref 0–53)
AST: 66 U/L — ABNORMAL HIGH (ref 0–37)
Albumin: 4.5 g/dL (ref 3.5–5.2)
Alkaline Phosphatase: 70 U/L (ref 39–117)
BUN: 14 mg/dL (ref 6–23)
CO2: 27 mEq/L (ref 19–32)
Calcium: 9.3 mg/dL (ref 8.4–10.5)
Chloride: 97 mEq/L (ref 96–112)
Creatinine, Ser: 0.93 mg/dL (ref 0.40–1.50)
GFR: 94.73 mL/min (ref >60.00)
Glucose, Bld: 86 mg/dL (ref 70–99)
Potassium: 4.3 mEq/L (ref 3.5–5.1)
Sodium: 136 mEq/L (ref 135–145)
Total Bilirubin: 1.5 mg/dL — ABNORMAL HIGH (ref 0.2–1.2)
Total Protein: 7.7 g/dL (ref 6.0–8.3)

## 2020-06-24 LAB — URIC ACID: Uric Acid, Serum: 8.1 mg/dL — ABNORMAL HIGH (ref 4.0–7.8)

## 2020-06-24 MED ORDER — ALLOPURINOL 100 MG PO TABS: 200.0000 mg | ORAL_TABLET | Freq: Every day | ORAL | 2 refills | Status: DC

## 2020-06-24 NOTE — Addendum Note (Signed)
Addended byConrad Lockland D on: 06/24/2020 12:55 PM   Modules accepted: Orders

## 2020-08-01 ENCOUNTER — Other Ambulatory Visit (INDEPENDENT_AMBULATORY_CARE_PROVIDER_SITE_OTHER): Payer: 59

## 2020-08-01 ENCOUNTER — Other Ambulatory Visit: Payer: Self-pay

## 2020-08-01 DIAGNOSIS — M1A9XX0 Chronic gout, unspecified, without tophus (tophi): Secondary | ICD-10-CM

## 2020-08-01 NOTE — Addendum Note (Signed)
Addended by: Rosita Kea on: 08/01/2020 02:49 PM   Modules accepted: Orders

## 2020-08-02 LAB — URIC ACID: Uric Acid, Serum: 3.9 mg/dL — ABNORMAL LOW (ref 4.0–8.0)

## 2020-08-08 ENCOUNTER — Encounter: Payer: Self-pay | Admitting: Family Medicine

## 2020-08-08 ENCOUNTER — Ambulatory Visit (HOSPITAL_BASED_OUTPATIENT_CLINIC_OR_DEPARTMENT_OTHER)
Admission: RE | Admit: 2020-08-08 | Discharge: 2020-08-08 | Disposition: A | Discharge status: Home / Self Care | Payer: 59 | Source: Ambulatory Visit | Attending: Family Medicine | Admitting: Family Medicine

## 2020-08-08 ENCOUNTER — Other Ambulatory Visit: Payer: Self-pay

## 2020-08-08 ENCOUNTER — Ambulatory Visit (INDEPENDENT_AMBULATORY_CARE_PROVIDER_SITE_OTHER): Payer: 59 | Admitting: Family Medicine

## 2020-08-08 VITALS — BP 122/74 | HR 94 | Temp 98.8°F | Ht 70.0 in | Wt 212.5 lb

## 2020-08-08 DIAGNOSIS — R1032 Left lower quadrant pain: Secondary | ICD-10-CM | POA: Insufficient documentation

## 2020-08-08 NOTE — Progress Notes (Signed)
Chief Complaint  Patient presents with   Abdominal Pain   Flank Pain    Terion Hedman is here for abdominal pain.  Duration: 2 weeks Nighttime awakenings? Yes Bleeding? No Weight loss? No Palliation: none Provocation: none Associated symptoms:  comes and goes Denies: fever, nausea, vomiting, urinary complaints, rashes, recent travel Treatment to date: hydrating  Past Medical History:  Diagnosis Date   Gout    Hypertension    Sickle cell trait (HCC)     BP 122/74   Pulse 94   Temp 98.8 F (37.1 C) (Oral)   Ht 5\' 10"  (1.778 m)   Wt 212 lb 8 oz (96.4 kg)   SpO2 94%   BMI 30.49 kg/m  Gen.: Awake, alert, appears stated age HEENT: Mucous membranes moist without mucosal lesions Heart: Regular rate and rhythm without murmurs Lungs: Clear auscultation bilaterally, no rales or wheezing, normal effort without accessory muscle use. Abdomen: Bowel sounds are present. Abdomen is soft, nontender, moderately distended, no masses or organomegaly. Negative Murphy's, Rovsing's, McBurney's, and Carnett's sign. Psych: Age appropriate judgment and insight. Normal mood and affect.  LLQ abdominal pain - Plan: DG Abd 1 View  Orders as above. If neg, will trial 7 d of Augmentin. He was found to have diverticula on CCS.  If still an issue, will get him back in w GI. F/u as originally scheduled.  Pt voiced understanding and agreement to the plan.  [place], DO 08/08/20 3:33 PM

## 2020-08-08 NOTE — Patient Instructions (Signed)
We will be in touch regarding your X-ray. If results are normal, we will send in an antibiotic to cover for infection. If still an issue, please call Dr. Barron Alvine.   OK to take Tylenol 1000 mg (2 extra strength tabs) or 975 mg (3 regular strength tabs) every 6 hours as needed.  Stay hydrated.  Let us know if you need anything.

## 2020-08-10 ENCOUNTER — Other Ambulatory Visit: Payer: Self-pay | Admitting: Family Medicine

## 2020-08-10 DIAGNOSIS — I10 Essential (primary) hypertension: Secondary | ICD-10-CM

## 2020-08-11 ENCOUNTER — Other Ambulatory Visit: Payer: Self-pay | Admitting: Family Medicine

## 2020-08-11 MED ORDER — AMOXICILLIN-POT CLAVULANATE 875-125 MG PO TABS
1.0000 | ORAL_TABLET | Freq: Two times a day (BID) | ORAL | 0 refills | Status: DC
Start: 2020-08-11 — End: 2020-08-29

## 2020-08-27 ENCOUNTER — Telehealth: Payer: Self-pay

## 2020-08-27 NOTE — Telephone Encounter (Signed)
If he's improved Ok to do 5 more days of the same abx, if not then please refer to GI. Ty.

## 2020-08-27 NOTE — Telephone Encounter (Signed)
Called the patient and mail box was full unable to leave a message. 

## 2020-08-27 NOTE — Telephone Encounter (Signed)
Pt wants to know if he can get a refill on the antibiotic since he is still having some pain

## 2020-08-29 MED ORDER — AMOXICILLIN-POT CLAVULANATE 875-125 MG PO TABS: 1.0000 | ORAL_TABLET | Freq: Two times a day (BID) | ORAL | 0 refills | Status: DC

## 2020-08-29 NOTE — Telephone Encounter (Signed)
Spoke to the patient and he is doing better. Would like 5 more days and thinks that will help him to completely heal. Sent in to pharmacy

## 2020-08-29 NOTE — Addendum Note (Signed)
Addended by: Scharlene Gloss B on: 08/29/2020 09:08 AM   Modules accepted: Orders

## 2021-06-26 ENCOUNTER — Other Ambulatory Visit: Payer: Self-pay | Admitting: Family Medicine

## 2021-10-17 ENCOUNTER — Encounter (HOSPITAL_BASED_OUTPATIENT_CLINIC_OR_DEPARTMENT_OTHER): Payer: Self-pay | Admitting: Emergency Medicine

## 2021-10-17 ENCOUNTER — Other Ambulatory Visit: Payer: Self-pay

## 2021-10-17 ENCOUNTER — Emergency Department (HOSPITAL_BASED_OUTPATIENT_CLINIC_OR_DEPARTMENT_OTHER): Payer: 59

## 2021-10-17 ENCOUNTER — Emergency Department (HOSPITAL_BASED_OUTPATIENT_CLINIC_OR_DEPARTMENT_OTHER)
Admission: EM | Admit: 2021-10-17 | Discharge: 2021-10-17 | Disposition: A | Discharge status: Home / Self Care | Payer: 59 | Attending: Emergency Medicine | Admitting: Emergency Medicine

## 2021-10-17 DIAGNOSIS — S0001XA Abrasion of scalp, initial encounter: Secondary | ICD-10-CM | POA: Diagnosis not present

## 2021-10-17 DIAGNOSIS — S060X9A Concussion with loss of consciousness of unspecified duration, initial encounter: Secondary | ICD-10-CM | POA: Diagnosis not present

## 2021-10-17 DIAGNOSIS — E049 Nontoxic goiter, unspecified: Secondary | ICD-10-CM

## 2021-10-17 DIAGNOSIS — M542 Cervicalgia: Secondary | ICD-10-CM | POA: Insufficient documentation

## 2021-10-17 DIAGNOSIS — M25511 Pain in right shoulder: Secondary | ICD-10-CM | POA: Diagnosis not present

## 2021-10-17 DIAGNOSIS — S0990XA Unspecified injury of head, initial encounter: Secondary | ICD-10-CM | POA: Diagnosis present

## 2021-10-17 DIAGNOSIS — E162 Hypoglycemia, unspecified: Secondary | ICD-10-CM | POA: Diagnosis not present

## 2021-10-17 LAB — CBG MONITORING, ED
Glucose-Capillary: 68 mg/dL — ABNORMAL LOW (ref 70–99)
Glucose-Capillary: 60 mg/dL — ABNORMAL LOW (ref 70–99)
Glucose-Capillary: 99 mg/dL (ref 70–99)
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
## 2021-10-17 NOTE — Discharge Instructions (Signed)
Your CT scan showed an enlarged thyroid gland. You will need a thyroid ultrasound, which your primary care doctor can arrange.  If you develop new or worsening headache, vomiting, confusion, or any other new/concerning symptoms, then return to the ER or call 911
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
## 2021-10-17 NOTE — ED Notes (Addendum)
Rt side pain  and rt shoulder blade hurt  Alert x4  Patient denies LOC Patient denies sob or chest pain
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
## 2021-10-17 NOTE — ED Notes (Signed)
Patients blood sugar is 99 now . Alert x4

## 2021-10-17 NOTE — ED Notes (Signed)
Patients blood sugar was 60 notified provider . Provider sates to give orange and cracker and peanut butter. Patient states that  that he has not eaten this am. Will continue to monitor. Patient showing no s/s of hypoglycemia

## 2021-10-22 ENCOUNTER — Ambulatory Visit (INDEPENDENT_AMBULATORY_CARE_PROVIDER_SITE_OTHER): Payer: 59 | Admitting: Family Medicine

## 2021-10-22 ENCOUNTER — Encounter: Payer: Self-pay | Admitting: Family Medicine

## 2021-10-22 VITALS — BP 130/62 | HR 88 | Temp 97.9°F | Resp 18 | Ht 70.0 in | Wt 202.0 lb

## 2021-10-22 DIAGNOSIS — E049 Nontoxic goiter, unspecified: Secondary | ICD-10-CM | POA: Diagnosis not present

## 2021-10-22 DIAGNOSIS — S46811A Strain of other muscles, fascia and tendons at shoulder and upper arm level, right arm, initial encounter: Secondary | ICD-10-CM | POA: Diagnosis not present

## 2021-10-22 DIAGNOSIS — M542 Cervicalgia: Secondary | ICD-10-CM

## 2021-10-22 MED ORDER — TIZANIDINE HCL 4 MG PO TABS: 4.0000 mg | ORAL_TABLET | Freq: Four times a day (QID) | ORAL | 0 refills | Status: DC | PRN

## 2021-10-22 MED ORDER — ALLOPURINOL 100 MG PO TABS
200.0000 mg | ORAL_TABLET | Freq: Every day | ORAL | 2 refills | Status: DC
Start: 2021-10-22 — End: 2022-02-23

## 2021-10-22 MED ORDER — AMLODIPINE BESYLATE 10 MG PO TABS: 10.0000 mg | ORAL_TABLET | Freq: Every day | ORAL | 2 refills | Status: DC

## 2021-10-22 NOTE — Patient Instructions (Addendum)
Give [place] 2-3 business days to get the results of your labs back.   We will be in touch regarding your ultrasound results.  Heat (pad or rice pillow in microwave) over affected area, 10-15 minutes twice daily.   Ice/cold pack over area for 10-15 min twice daily.  OK to take Tylenol 1000 mg (2 extra strength tabs) or 975 mg (3 regular strength tabs) every 6 hours as needed.  Ibuprofen 400-600 mg (2-3 over the counter strength tabs) every 6 hours as needed for pain.  Let us know if you need anything.   Trapezius stretches/exercises Do exercises exactly as told by your health care provider and adjust them as directed. It is normal to feel mild stretching, pulling, tightness, or discomfort as you do these exercises, but you should stop right away if you feel sudden pain or your pain gets worse.   Stretching and range of motion exercises These exercises warm up your muscles and joints and improve the movement and flexibility of your shoulder. These exercises can also help to relieve pain, numbness, and tingling. If you are unable to do any of the following for any reason, do not further attempt to do it.   Exercise A: Flexion, standing     Stand and hold a broomstick, a cane, or a similar object. Place your hands a little more than shoulder-width apart on the object. Your left / right hand should be palm-up, and your other hand should be palm-down. Push the stick to raise your left / right arm out to your side and then over your head. Use your other hand to help move the stick. Stop when you feel a stretch in your shoulder, or when you reach the angle that is recommended by your health care provider. Avoid shrugging your shoulder while you raise your arm. Keep your shoulder blade tucked down toward your spine. Hold for 30 seconds. Slowly return to the starting position. Repeat 2 times. Complete this exercise 3 times per week.  Exercise B: Abduction, supine     Lie on your back and hold a  broomstick, a cane, or a similar object. Place your hands a little more than shoulder-width apart on the object. Your left / right hand should be palm-up, and your other hand should be palm-down. Push the stick to raise your left / right arm out to your side and then over your head. Use your other hand to help move the stick. Stop when you feel a stretch in your shoulder, or when you reach the angle that is recommended by your health care provider. Avoid shrugging your shoulder while you raise your arm. Keep your shoulder blade tucked down toward your spine. Hold for 30 seconds. Slowly return to the starting position. Repeat 2 times. Complete this exercise 3 times per week.  Exercise C: Flexion, active-assisted     Lie on your back. You may bend your knees for comfort. Hold a broomstick, a cane, or a similar object. Place your hands about shoulder-width apart on the object. Your palms should face toward your feet. Raise the stick and move your arms over your head and behind your head, toward the floor. Use your healthy arm to help your left / right arm move farther. Stop when you feel a gentle stretch in your shoulder, or when you reach the angle where your health care provider tells you to stop. Hold for 30 seconds. Slowly return to the starting position. Repeat 2 times. Complete this exercise 3 times  per week.  Exercise D: External rotation and abduction     Stand in a door frame with one of your feet slightly in front of the other. This is called a staggered stance. Choose one of the following positions as told by your health care provider: Place your hands and forearms on the door frame above your head. Place your hands and forearms on the door frame at the height of your head. Place your hands on the door frame at the height of your elbows. Slowly move your weight onto your front foot until you feel a stretch across your chest and in the front of your shoulders. Keep your head and chest  upright and keep your abdominal muscles tight. Hold for 30 seconds. To release the stretch, shift your weight to your back foot. Repeat 2 times. Complete this stretch 3 times per week.  Strengthening exercises These exercises build strength and endurance in your shoulder. Endurance is the ability to use your muscles for a long time, even after your muscles get tired. Exercise E: Scapular depression and adduction  Sit on a stable chair. Support your arms in front of you with pillows, armrests, or a tabletop. Keep your elbows in line with the sides of your body. Gently move your shoulder blades down toward your middle back. Relax the muscles on the tops of your shoulders and in the back of your neck. Hold for 3 seconds. Slowly release the tension and relax your muscles completely before doing this exercise again. Repeat for a total of 10 repetitions. After you have practiced this exercise, try doing the exercise without the arm support. Then, try the exercise while standing instead of sitting. Repeat 2 times. Complete this exercise 3 times per week.  Exercise F: Shoulder abduction, isometric     Stand or sit about 4-6 inches (10-15 cm) from a wall with your left / right side facing the wall. Bend your left / right elbow and gently press your elbow against the wall. Increase the pressure slowly until you are pressing as hard as you can without shrugging your shoulder. Hold for 3 seconds. Slowly release the tension and relax your muscles completely. Repeat for a total of 10 repetitions. Repeat 2 times. Complete this exercise 3 times per week.  Exercise G: Shoulder flexion, isometric     Stand or sit about 4-6 inches (10-15 cm) away from a wall with your left / right side facing the wall. Keep your left / right elbow straight and gently press the top of your fist against the wall. Increase the pressure slowly until you are pressing as hard as you can without shrugging your shoulder. Hold for  10-15 seconds. Slowly release the tension and relax your muscles completely. Repeat for a total of 10 repetitions. Repeat 2 times. Complete this exercise 3 times per week.  Exercise H: Internal rotation     Sit in a stable chair without armrests, or stand. Secure an exercise band at your left / right side, at elbow height. Place a soft object, such as a folded towel or a small pillow, under your left / right upper arm so your elbow is a few inches (about 8 cm) away from your side. Hold the end of the exercise band so the band stretches. Keeping your elbow pressed against the soft object under your arm, move your forearm across your body toward your abdomen. Keep your body steady so the movement is only coming from your shoulder. Hold for 3 seconds.  Slowly return to the starting position. Repeat for a total of 10 repetitions. Repeat 2 times. Complete this exercise 3 times per week.  Exercise I: External rotation     Sit in a stable chair without armrests, or stand. Secure an exercise band at your left / right side, at elbow height. Place a soft object, such as a folded towel or a small pillow, under your left / right upper arm so your elbow is a few inches (about 8 cm) away from your side. Hold the end of the exercise band so the band stretches. Keeping your elbow pressed against the soft object under your arm, move your forearm out, away from your abdomen. Keep your body steady so the movement is only coming from your shoulder. Hold for 3 seconds. Slowly return to the starting position. Repeat for a total of 10 repetitions. Repeat 2 times. Complete this exercise 3 times per week. Exercise J: Shoulder extension  Sit in a stable chair without armrests, or stand. Secure an exercise band to a stable object in front of you so the band is at shoulder height. Hold one end of the exercise band in each hand. Your palms should face each other. Straighten your elbows and lift your hands up to  shoulder height. Step back, away from the secured end of the exercise band, until the band stretches. Squeeze your shoulder blades together and pull your hands down to the sides of your thighs. Stop when your hands are straight down by your sides. Do not let your hands go behind your body. Hold for 3 seconds. Slowly return to the starting position. Repeat for a total of 10 repetitions. Repeat 2 times. Complete this exercise 3 times per week.  Exercise K: Shoulder extension, prone     Lie on your abdomen on a firm surface so your left / right arm hangs over the edge. Hold a 5 lb weight in your hand so your palm faces in toward your body. Your arm should be straight. Squeeze your shoulder blade down toward the middle of your back. Slowly raise your arm behind you, up to the height of the surface that you are lying on. Keep your arm straight. Hold for 3 seconds. Slowly return to the starting position and relax your muscles. Repeat for a total of 10 repetitions. Repeat 2 times. Complete this exercise 3 times per week.   Exercise L: Horizontal abduction, prone  Lie on your abdomen on a firm surface so your left / right arm hangs over the edge. Hold a 5 lb weight in your hand so your palm faces toward your feet. Your arm should be straight. Squeeze your shoulder blade down toward the middle of your back. Bend your elbow so your hand moves up, until your elbow is bent to an "L" shape (90 degrees). With your elbow bent, slowly move your forearm forward and up. Raise your hand up to the height of the surface that you are lying on. Your upper arm should not move, and your elbow should stay bent. At the top of the movement, your palm should face the floor. Hold for 3 seconds. Slowly return to the starting position and relax your muscles. Repeat for a total of 10 repetitions. Repeat 2 times. Complete this exercise 3 times per week.  Exercise M: Horizontal abduction, standing  Sit on a stable chair,  or stand. Secure an exercise band to a stable object in front of you so the band is at shoulder height.  Hold one end of the exercise band in each hand. Straighten your elbows and lift your hands straight in front of you, up to shoulder height. Your palms should face down, toward the floor. Step back, away from the secured end of the exercise band, until the band stretches. Move your arms out to your sides, and keep your arms straight. Hold for 3 seconds. Slowly return to the starting position. Repeat for a total of 10 repetitions. Repeat 2 times. Complete this exercise 3 times per week.  Exercise N: Scapular retraction and elevation  Sit on a stable chair, or stand. Secure an exercise band to a stable object in front of you so the band is at shoulder height. Hold one end of the exercise band in each hand. Your palms should face each other. Sit in a stable chair without armrests, or stand. Step back, away from the secured end of the exercise band, until the band stretches. Squeeze your shoulder blades together and lift your hands over your head. Keep your elbows straight. Hold for 3 seconds. Slowly return to the starting position. Repeat for a total of 10 repetitions. Repeat 2 times. Complete this exercise 3 times per week.  This information is not intended to replace advice given to you by your health care provider. Make sure you discuss any questions you have with your health care provider. Document Released: 01/25/2005 Document Revised: 10/02/2015 Document Reviewed: 12/12/2014 Elsevier Interactive Patient Education  2017 [employer].

## 2021-10-22 NOTE — Progress Notes (Signed)
Musculoskeletal Exam  Patient: Miguel Harrison DOB: 1970-10-25  DOS: 10/22/2021  SUBJECTIVE:  Chief Complaint:   Chief Complaint  Patient presents with   ER follow up    Pt says he still has neck pain and chest pain when coughing.    Miguel Harrison is a 51 y.o.  male for evaluation and treatment of neck pain. Here w spouse.  Onset:  5 days ago. No inj or change in activity.  Location: R side of neck Character:  tight band  Progression of issue:  has improved Associated symptoms: decreased ROM No redness, bruising, swelling Treatment: to date has been acetaminophen and Aspercreme.   Neurovascular symptoms: no  Patient went to the ED for the above issue and was found to have an enlarged thyroid.  Ultrasound was recommended.  He does have trouble swallowing pills but he thinks this is more mental.  He has no difficulty when eating food or consuming liquids.  Past Medical History:  Diagnosis Date   Gout    Hypertension    Sickle cell trait (HCC)     Objective: VITAL SIGNS: BP 130/62 (BP Location: Left Arm, Cuff Size: Normal)   Pulse 88   Temp 97.9 F (36.6 C) (Temporal)   Resp 18   Ht 5\' 10"  (1.778 m)   Wt 202 lb (91.6 kg)   SpO2 98%   BMI 28.98 kg/m  Constitutional: Well formed, well developed. No acute distress. Thorax & Lungs: No accessory muscle use Neck: + Nodularity noted over upper poles of thyroid, no obvious goiter, no induration Musculoskeletal: R shoulder/neck.   Normal active range of motion: yes.   Normal passive range of motion: yes Tenderness to palpation: yes over R trap Deformity: no Ecchymosis: no Tests positive: none  Tests negative: Spurling's Neurologic: Normal sensory function. No focal deficits noted. DTR's equal and symmetric in UE's. No clonus. Grip strength adequate. Psychiatric: Normal mood. Age appropriate judgment and insight. Alert & oriented x 3.    Assessment:  Neck pain - Plan: tiZANidine (ZANAFLEX) 4 MG tablet  Enlarged  thyroid - Plan: TSH, T4, free, THYROID  Strain of right trapezius muscle, initial encounter - Plan: tiZANidine (ZANAFLEX) 4 MG tablet  Plan: Stretches/exercises for the trapezius, heat, ice, Tylenol.  Zanaflex as needed, warned about potential drowsiness. I did appreciate nodularity over his thyroid, will check anatomy with an ultrasound as recommended by radiology.  Check TSH and free T4.  I do think he needs to see ENT for possible removal as he is not having difficulty swallowing food at this point. F/u in 1 month for physical. The patient and his spouse voiced understanding and agreement to the plan.   [place] Kite, DO 10/22/21  4:26 PM

## 2021-10-23 LAB — TSH: TSH: 0.33 u[IU]/mL — ABNORMAL LOW (ref 0.35–5.50)

## 2021-10-23 LAB — T4, FREE: Free T4: 0.98 ng/dL (ref 0.60–1.60)

## 2021-10-26 ENCOUNTER — Ambulatory Visit (HOSPITAL_BASED_OUTPATIENT_CLINIC_OR_DEPARTMENT_OTHER): Admission: RE | Admit: 2021-10-26 | Payer: 59 | Source: Ambulatory Visit

## 2021-11-02 ENCOUNTER — Ambulatory Visit (HOSPITAL_BASED_OUTPATIENT_CLINIC_OR_DEPARTMENT_OTHER)
Admission: RE | Admit: 2021-11-02 | Discharge: 2021-11-02 | Disposition: A | Discharge status: Home / Self Care | Payer: 59 | Source: Ambulatory Visit | Attending: Family Medicine | Admitting: Family Medicine

## 2021-11-02 DIAGNOSIS — E049 Nontoxic goiter, unspecified: Secondary | ICD-10-CM

## 2021-11-03 ENCOUNTER — Other Ambulatory Visit: Payer: Self-pay | Admitting: Family Medicine

## 2021-11-03 DIAGNOSIS — E042 Nontoxic multinodular goiter: Secondary | ICD-10-CM

## 2021-11-26 ENCOUNTER — Telehealth: Payer: Self-pay

## 2021-11-26 NOTE — Telephone Encounter (Signed)
Caller Name [place] Phone Number [phone] Call Type Message Only Information Provided Reason for Call Returning a Call from the Office Initial Comment Caller states he is returning a call. Disp. Time Disposition Final User 11/25/2021 5:15:42 PM General Information Provided Yes Ronnald Ramp, [place] Call Closed By: Eliane Decree Transaction Date/Time: 11/25/2021 5:14:00 PM (ET)

## 2021-11-26 NOTE — Telephone Encounter (Signed)
The phone call was an appt reminder

## 2021-11-30 ENCOUNTER — Ambulatory Visit (INDEPENDENT_AMBULATORY_CARE_PROVIDER_SITE_OTHER): Payer: 59 | Admitting: Family Medicine

## 2021-11-30 ENCOUNTER — Encounter: Payer: Self-pay | Admitting: Family Medicine

## 2021-11-30 VITALS — BP 138/78 | HR 94 | Temp 98.8°F | Ht 70.0 in | Wt 197.2 lb

## 2021-11-30 DIAGNOSIS — Z125 Encounter for screening for malignant neoplasm of prostate: Secondary | ICD-10-CM

## 2021-11-30 DIAGNOSIS — M1A9XX0 Chronic gout, unspecified, without tophus (tophi): Secondary | ICD-10-CM | POA: Diagnosis not present

## 2021-11-30 DIAGNOSIS — Z0000 Encounter for general adult medical examination without abnormal findings: Secondary | ICD-10-CM | POA: Diagnosis not present

## 2021-11-30 NOTE — Progress Notes (Signed)
Chief Complaint  Patient presents with   Annual Exam    Well Male Miguel Harrison is here for a complete physical.   His last physical was >1 year ago.  Current diet: in general, a "healthy" diet.  Current exercise: none Weight trend: lost a few lbs Fatigue out of ordinary? No. Seat belt? Yes.   Advanced directive? No  Health maintenance Shingrix- No Colonoscopy- Yes Tetanus- Yes HIV- Yes Hep C- Yes   Past Medical History:  Diagnosis Date   Gout    Hypertension    Sickle cell trait (HCC)     Past Surgical History:  Procedure Laterality Date   NECK SURGERY     Medications  Current Outpatient Medications on File Prior to Visit  Medication Sig Dispense Refill   allopurinol (ZYLOPRIM) 100 MG tablet Take 2 tablets (200 mg total) by mouth daily. 180 tablet 2   amLODipine (NORVASC) 10 MG tablet Take 1 tablet (10 mg total) by mouth daily. 90 tablet 2   Allergies No Known Allergies  Family History Family History  Problem Relation Age of Onset   Hypertension Mother    Colon cancer Neg Hx    Colon polyps Neg Hx    Esophageal cancer Neg Hx    Rectal cancer Neg Hx    Stomach cancer Neg Hx     Review of Systems: Constitutional:  no fevers Eye:  no recent significant change in vision Ear/Nose/Mouth/Throat:  Ears:  no hearing loss Nose/Mouth/Throat:  no complaints of nasal congestion, no sore throat Cardiovascular:  no chest pain Respiratory:  no shortness of breath Gastrointestinal:  no change in bowel habits GU:  Male: negative for dysuria, frequency Musculoskeletal/Extremities:  no joint pain Integumentary (Skin/Breast):  no abnormal skin lesions reported Neurologic:  no headaches Endocrine: No unexpected weight changes Hematologic/Lymphatic:  no abnormal bleeding  Exam BP 138/78 (BP Location: Left Arm, Patient Position: Sitting, Cuff Size: Normal)   Pulse 94   Temp 98.8 F (37.1 C) (Oral)   Ht 5\' 10"  (1.778 m)   Wt 197 lb 4 oz (89.5 kg)   SpO2 99%    BMI 28.30 kg/m  General:  well developed, well nourished, in no apparent distress Skin:  no significant moles, warts, or growths Head:  no masses, lesions, or tenderness Eyes:  pupils equal and round, sclera anicteric without injection Ears:  canals without lesions, TMs shiny without retraction, no obvious effusion, no erythema Nose:  nares patent, mucosa normal Throat/Pharynx:  lips and gingiva without lesion; tongue and uvula midline; non-inflamed pharynx; no exudates or postnasal drainage Neck: neck supple without adenopathy, thyromegaly, or masses Cardiac: RRR, no bruits, no LE edema Lungs:  clear to auscultation, breath sounds equal bilaterally, no respiratory distress Abdomen: BS+, soft, non-tender, non-distended, no masses or organomegaly noted Rectal: Deferred Musculoskeletal:  symmetrical muscle groups noted without atrophy or deformity Neuro:  gait normal; deep tendon reflexes normal and symmetric Psych: well oriented with normal range of affect and appropriate judgment/insight  Assessment and Plan  Well adult exam - Plan: CBC, Comprehensive metabolic panel, Lipid panel  Chronic gout involving toe without tophus, unspecified cause, unspecified laterality - Plan: Uric acid  Screening for prostate cancer - Plan: PSA   Well 51 y.o. male. Counseled on diet and exercise. Counseled on risks and benefits of prostate cancer screening with PSA. The patient agrees to undergo testing. Advanced directive form provided today.  Shingrix rec'd.  Immunizations, labs, and further orders as above. Follow up in 6 mo or  prn. The patient voiced understanding and agreement to the plan.  Miguel Roche [name], DO 11/30/21 2:56 PM

## 2021-11-30 NOTE — Patient Instructions (Addendum)
Give us 2-3 business days to get the results of your labs back.   Keep the diet clean and stay active.  Please get me a copy of your advanced directive form at your convenience.   The Shingrix vaccine (for shingles) is a 2 shot series spaced 2-6 months apart. It can make people feel low energy, achy and almost like they have the flu for 48 hours after injection. 1/5 people can have nausea and/or vomiting. Please plan accordingly when deciding on when to get this shot. Call our office for a nurse visit appointment to get this. The second shot of the series is less severe regarding the side effects, but it still lasts 48 hours.   Let us know if you need anything.  

## 2021-12-01 ENCOUNTER — Other Ambulatory Visit: Payer: Self-pay | Admitting: Family Medicine

## 2021-12-01 DIAGNOSIS — E876 Hypokalemia: Secondary | ICD-10-CM

## 2021-12-01 DIAGNOSIS — D649 Anemia, unspecified: Secondary | ICD-10-CM

## 2021-12-01 LAB — LIPID PANEL
Cholesterol: 154 mg/dL (ref 0–200)
HDL: 74.1 mg/dL (ref >39.00)
LDL Cholesterol: 58 mg/dL (ref 0–99)
NonHDL: 80.23
Total CHOL/HDL Ratio: 2
Triglycerides: 111 mg/dL (ref 0.0–149.0)
VLDL: 22.2 mg/dL (ref 0.0–40.0)

## 2021-12-01 LAB — CBC
HCT: 39.1 % (ref 39.0–52.0)
Hemoglobin: 12.8 g/dL — ABNORMAL LOW (ref 13.0–17.0)
MCHC: 32.8 g/dL (ref 30.0–36.0)
MCV: 97.1 fl (ref 78.0–100.0)
Platelets: 159 10*3/uL (ref 150.0–400.0)
RBC: 4.03 Mil/uL — ABNORMAL LOW (ref 4.22–5.81)
RDW: 12 % (ref 11.5–15.5)
WBC: 7.6 10*3/uL (ref 4.0–10.5)

## 2021-12-01 LAB — COMPREHENSIVE METABOLIC PANEL
ALT: 19 U/L (ref 0–53)
AST: 22 U/L (ref 0–37)
Albumin: 4.2 g/dL (ref 3.5–5.2)
Alkaline Phosphatase: 57 U/L (ref 39–117)
BUN: 11 mg/dL (ref 6–23)
CO2: 29 mEq/L (ref 19–32)
Calcium: 9.4 mg/dL (ref 8.4–10.5)
Chloride: 100 mEq/L (ref 96–112)
Creatinine, Ser: 0.83 mg/dL (ref 0.40–1.50)
GFR: 99.96 mL/min (ref >60.00)
Glucose, Bld: 78 mg/dL (ref 70–99)
Potassium: 3.3 mEq/L — ABNORMAL LOW (ref 3.5–5.1)
Sodium: 139 mEq/L (ref 135–145)
Total Bilirubin: 0.9 mg/dL (ref 0.2–1.2)
Total Protein: 7.4 g/dL (ref 6.0–8.3)

## 2021-12-01 LAB — URIC ACID: Uric Acid, Serum: 4.1 mg/dL (ref 4.0–7.8)

## 2021-12-01 LAB — PSA: PSA: 0.87 ng/mL (ref 0.10–4.00)

## 2021-12-17 ENCOUNTER — Other Ambulatory Visit (INDEPENDENT_AMBULATORY_CARE_PROVIDER_SITE_OTHER): Payer: 59

## 2021-12-17 DIAGNOSIS — D649 Anemia, unspecified: Secondary | ICD-10-CM | POA: Diagnosis not present

## 2021-12-17 DIAGNOSIS — E876 Hypokalemia: Secondary | ICD-10-CM

## 2021-12-17 LAB — BASIC METABOLIC PANEL
BUN: 13 mg/dL (ref 6–23)
CO2: 29 mEq/L (ref 19–32)
Calcium: 9 mg/dL (ref 8.4–10.5)
Chloride: 102 mEq/L (ref 96–112)
Creatinine, Ser: 1.04 mg/dL (ref 0.40–1.50)
GFR: 81.98 mL/min (ref >60.00)
Glucose, Bld: 86 mg/dL (ref 70–99)
Potassium: 3.9 mEq/L (ref 3.5–5.1)
Sodium: 139 mEq/L (ref 135–145)

## 2021-12-17 LAB — CBC WITH DIFFERENTIAL/PLATELET
Basophils Absolute: 0 10*3/uL (ref 0.0–0.1)
Basophils Relative: 0.7 % (ref 0.0–3.0)
Eosinophils Absolute: 0.1 10*3/uL (ref 0.0–0.7)
Eosinophils Relative: 2.3 % (ref 0.0–5.0)
HCT: 37.9 % — ABNORMAL LOW (ref 39.0–52.0)
Hemoglobin: 12.8 g/dL — ABNORMAL LOW (ref 13.0–17.0)
Lymphocytes Relative: 20.5 % (ref 12.0–46.0)
Lymphs Abs: 1.1 10*3/uL (ref 0.7–4.0)
MCHC: 33.7 g/dL (ref 30.0–36.0)
MCV: 95.2 fl (ref 78.0–100.0)
Monocytes Absolute: 0.7 10*3/uL (ref 0.1–1.0)
Monocytes Relative: 12.2 % — ABNORMAL HIGH (ref 3.0–12.0)
Neutro Abs: 3.6 10*3/uL (ref 1.4–7.7)
Neutrophils Relative %: 64.3 % (ref 43.0–77.0)
Platelets: 170 10*3/uL (ref 150.0–400.0)
RBC: 3.98 Mil/uL — ABNORMAL LOW (ref 4.22–5.81)
RDW: 11.8 % (ref 11.5–15.5)
WBC: 5.6 10*3/uL (ref 4.0–10.5)

## 2021-12-18 LAB — IRON,TIBC AND FERRITIN PANEL
%SAT: 28 % (calc) (ref 20–48)
Ferritin: 457 ng/mL — ABNORMAL HIGH (ref 38–380)
Iron: 80 ug/dL (ref 50–180)
TIBC: 288 mcg/dL (calc) (ref 250–425)

## 2021-12-22 ENCOUNTER — Encounter: Payer: Self-pay | Admitting: Family Medicine

## 2022-02-23 ENCOUNTER — Telehealth: Payer: Self-pay | Admitting: Family Medicine

## 2022-02-23 MED ORDER — AMLODIPINE BESYLATE 10 MG PO TABS: 10.0000 mg | ORAL_TABLET | Freq: Every day | ORAL | 2 refills | Status: DC

## 2022-02-23 MED ORDER — ALLOPURINOL 100 MG PO TABS: 200.0000 mg | ORAL_TABLET | Freq: Every day | ORAL | 2 refills | Status: DC

## 2022-02-23 NOTE — Telephone Encounter (Signed)
Refill done Patient notified. 

## 2022-02-23 NOTE — Telephone Encounter (Signed)
Patient would like to switch pharmacies and get refills for his medications sent to his new pharmacy.   1.allopurinol (ZYLOPRIM) 100 MG tablet  2.amLODipine (NORVASC) 10 MG table    [facility] pharmacy [address] [place], [place] [address] [phone]

## 2022-05-05 ENCOUNTER — Other Ambulatory Visit: Payer: Self-pay | Admitting: Otolaryngology

## 2022-05-05 ENCOUNTER — Encounter: Payer: Self-pay | Admitting: Otolaryngology

## 2022-05-05 DIAGNOSIS — E042 Nontoxic multinodular goiter: Secondary | ICD-10-CM

## 2022-06-18 ENCOUNTER — Other Ambulatory Visit: Payer: 59

## 2022-07-02 ENCOUNTER — Other Ambulatory Visit (HOSPITAL_COMMUNITY)
Admission: RE | Admit: 2022-07-02 | Discharge: 2022-07-02 | Disposition: A | Discharge status: Home / Self Care | Payer: 59 | Source: Ambulatory Visit | Attending: Otolaryngology | Admitting: Otolaryngology

## 2022-07-02 ENCOUNTER — Ambulatory Visit
Admission: RE | Admit: 2022-07-02 | Discharge: 2022-07-02 | Disposition: A | Discharge status: Home / Self Care | Payer: 59 | Source: Ambulatory Visit | Attending: Otolaryngology | Admitting: Otolaryngology

## 2022-07-02 DIAGNOSIS — E042 Nontoxic multinodular goiter: Secondary | ICD-10-CM

## 2022-07-02 DIAGNOSIS — E041 Nontoxic single thyroid nodule: Secondary | ICD-10-CM | POA: Insufficient documentation

## 2022-07-07 LAB — CYTOLOGY - NON PAP

## 2022-07-14 ENCOUNTER — Other Ambulatory Visit: Payer: Self-pay | Admitting: Otolaryngology

## 2022-07-14 DIAGNOSIS — E042 Nontoxic multinodular goiter: Secondary | ICD-10-CM

## 2022-09-01 ENCOUNTER — Other Ambulatory Visit: Payer: 59

## 2022-09-08 ENCOUNTER — Ambulatory Visit: Admission: RE | Admit: 2022-09-08 | Payer: 59 | Source: Ambulatory Visit

## 2022-09-08 ENCOUNTER — Other Ambulatory Visit (HOSPITAL_COMMUNITY)
Admission: RE | Admit: 2022-09-08 | Discharge: 2022-09-08 | Disposition: A | Discharge status: Home / Self Care | Payer: 59 | Source: Ambulatory Visit | Attending: Interventional Radiology | Admitting: Interventional Radiology

## 2022-09-08 ENCOUNTER — Ambulatory Visit
Admission: RE | Admit: 2022-09-08 | Discharge: 2022-09-08 | Disposition: A | Discharge status: Home / Self Care | Payer: 59 | Source: Ambulatory Visit | Attending: Otolaryngology | Admitting: Otolaryngology

## 2022-09-08 DIAGNOSIS — R896 Abnormal cytological findings in specimens from other organs, systems and tissues: Secondary | ICD-10-CM | POA: Diagnosis not present

## 2022-09-08 DIAGNOSIS — E041 Nontoxic single thyroid nodule: Secondary | ICD-10-CM | POA: Diagnosis present

## 2022-09-08 DIAGNOSIS — E042 Nontoxic multinodular goiter: Secondary | ICD-10-CM

## 2022-10-12 ENCOUNTER — Other Ambulatory Visit: Payer: Self-pay | Admitting: Otolaryngology

## 2022-10-12 DIAGNOSIS — E042 Nontoxic multinodular goiter: Secondary | ICD-10-CM

## 2023-03-18 ENCOUNTER — Other Ambulatory Visit: Payer: 59

## 2023-03-25 ENCOUNTER — Other Ambulatory Visit: Payer: Self-pay | Admitting: Family Medicine

## 2023-03-25 NOTE — Telephone Encounter (Signed)
Copied from CRM [id number]. Topic: Clinical - Medication Refill >> Mar 25, 2023  4:45 PM Denese Killings wrote: Most Recent Primary Care Visit:  Provider: [facility]  Department: [facility]  Visit Type: LAB  Date: 12/17/2021  Medication: allopurinol (ZYLOPRIM) 100 MG tablet  Has the patient contacted their pharmacy? Yes (Agent: If no, request that the patient contact the pharmacy for the refill. If patient does not wish to contact the pharmacy document the reason why and proceed with request.) (Agent: If yes, when and what did the pharmacy advise?) prescription expired call doctor   Is this the correct pharmacy for this prescription? Yes If no, delete pharmacy and type the correct one.  This is the patient's preferred pharmacy:  [facility] [id number] [facility], [place] - [address] [place] [address] Phone: [phone] Fax: [phone]  Has the prescription been filled recently? Yes  Is the patient out of the medication? No  Has the patient been seen for an appointment in the last year OR does the patient have an upcoming appointment? No  Can we respond through MyChart? No  Agent: Please be advised that Rx refills may take up to 3 business days. We ask that you follow-up with your pharmacy.

## 2023-04-08 ENCOUNTER — Ambulatory Visit
Admission: RE | Admit: 2023-04-08 | Discharge: 2023-04-08 | Disposition: A | Discharge status: Home / Self Care | Payer: 59 | Source: Ambulatory Visit | Attending: Otolaryngology | Admitting: Otolaryngology

## 2023-04-08 DIAGNOSIS — E042 Nontoxic multinodular goiter: Secondary | ICD-10-CM

## 2023-04-29 IMAGING — DX DG ABDOMEN 1V
1 series · 1 of 1 positions shown · non-contrast
Comparison: None.

CLINICAL DATA: Left lower quadrant pain

EXAM:
ABDOMEN - 1 VIEW

[abdomen kub]
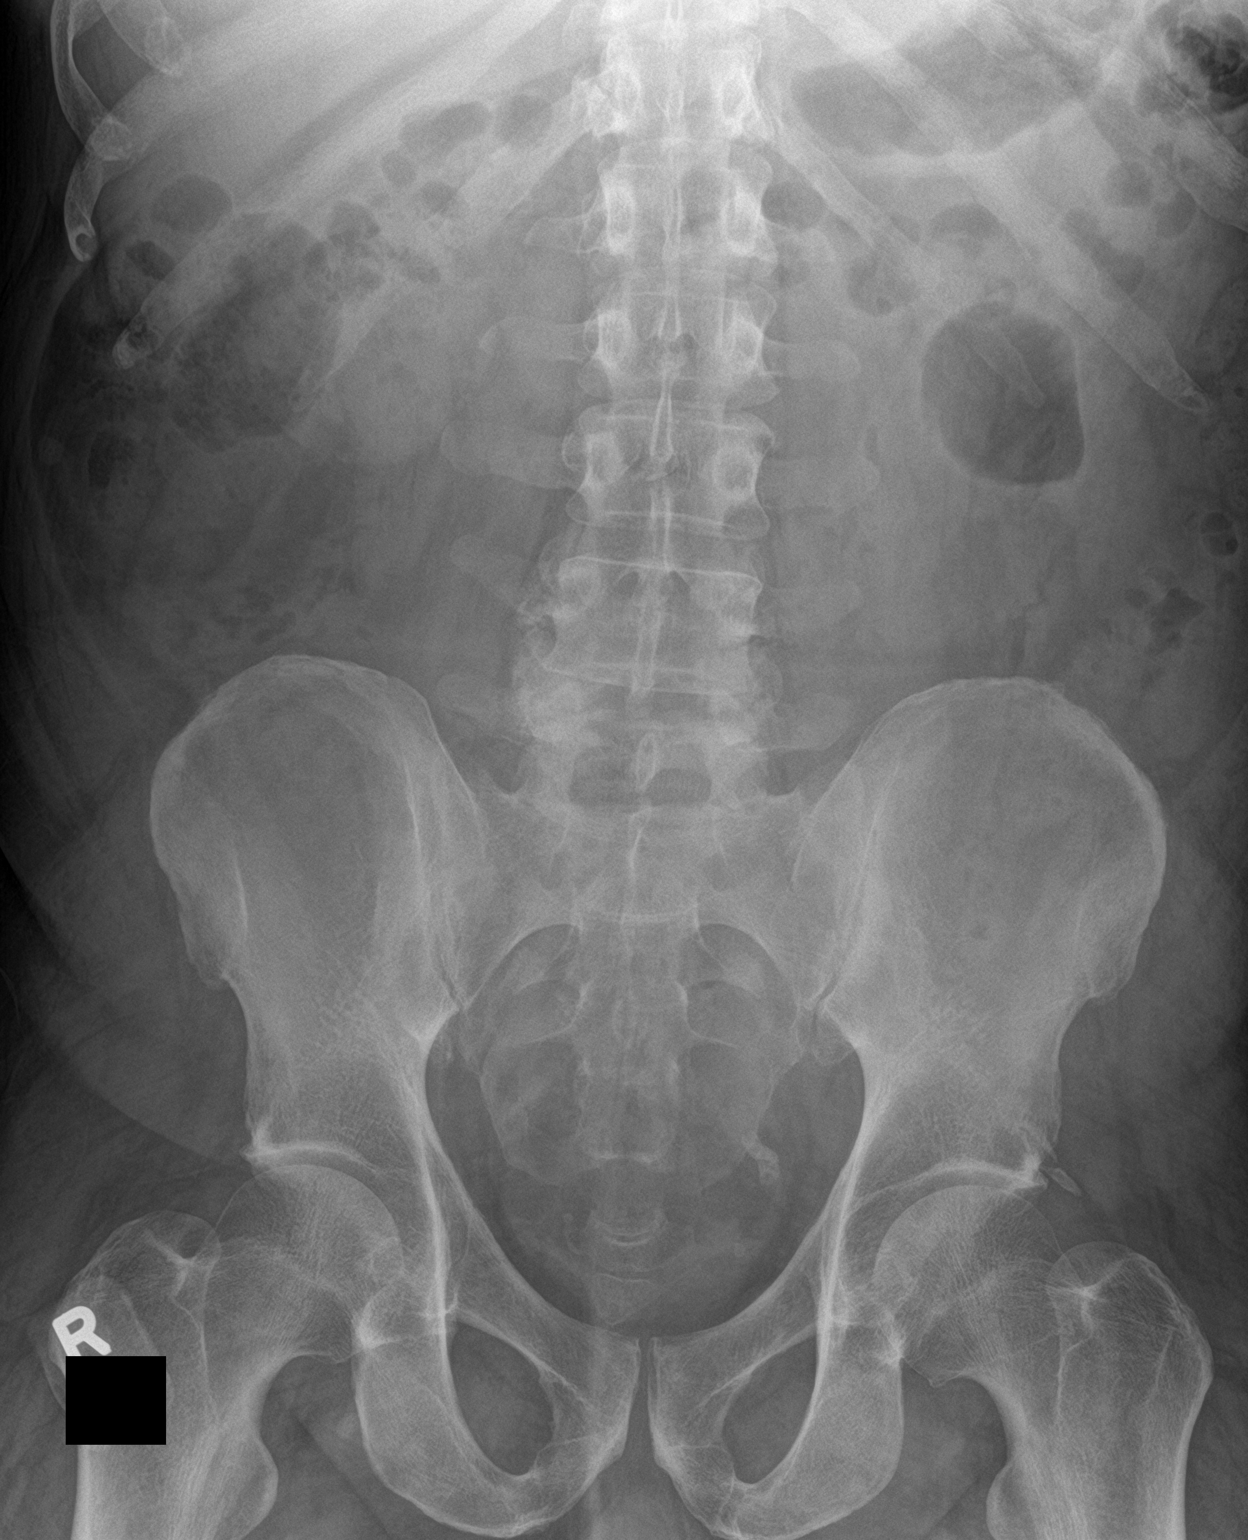

[1 of 1 positions shown; findings below may reference images not displayed]

FINDINGS: The bowel gas pattern is normal. No radio-opaque calculi or other
significant radiographic abnormality are seen.
IMPRESSION: Negative.

## 2023-06-03 ENCOUNTER — Other Ambulatory Visit: Payer: Self-pay | Admitting: Family Medicine

## 2023-06-14 ENCOUNTER — Telehealth: Payer: Self-pay | Admitting: Family Medicine

## 2023-06-14 NOTE — Telephone Encounter (Signed)
 Copied from CRM [id number]. Topic: General - Other >> Jun 14, 2023  2:46 PM Adrionna Y wrote: Reason for CRM: Patient received a brochure about Colorectal Cancer. He would like a call to go over brochure to see if it is necessary for him and if it sent by the office

## 2023-06-15 NOTE — Telephone Encounter (Signed)
 Called pt was advised it wasn't from us , he haven't been seen since last year. Tried schedule follow up Appt. Pt stated he will call back to make the appointment.

## 2023-07-09 ENCOUNTER — Other Ambulatory Visit: Payer: Self-pay | Admitting: Family Medicine

## 2023-07-25 ENCOUNTER — Other Ambulatory Visit: Payer: Self-pay | Admitting: Family Medicine

## 2023-07-25 NOTE — Telephone Encounter (Signed)
 Copied from CRM [id number]. Topic: Clinical - Medication Refill >> Jul 25, 2023  3:25 PM Peg Bouton F wrote: Medication: amLODipine  (NORVASC ) 10 MG tablet allopurinol  (ZYLOPRIM ) 100 MG tablet  Has the patient contacted their pharmacy? Yes, they told him to call PCP (Agent: If no, request that the patient contact the pharmacy for the refill. If patient does not wish to contact the pharmacy document the reason why and proceed with request.) (Agent: If yes, when and what did the pharmacy advise?)  This is the patient's preferred pharmacy:  [facility] [id number] [place], [place] - [address] [place] [address] Phone: [phone] Fax: [phone]  Is this the correct pharmacy for this prescription? Yes If no, delete pharmacy and type the correct one.   Has the prescription been filled recently? No  Is the patient out of the medication? No  Has the patient been seen for an appointment in the last year OR does the patient have an upcoming appointment? No  Can we respond through MyChart? No, please call [phone]  Agent: Please be advised that Rx refills may take up to 3 business days. We ask that you follow-up with your pharmacy.

## 2023-07-28 ENCOUNTER — Other Ambulatory Visit: Payer: Self-pay | Admitting: Family Medicine

## 2023-08-08 ENCOUNTER — Ambulatory Visit (INDEPENDENT_AMBULATORY_CARE_PROVIDER_SITE_OTHER): Admitting: Family Medicine

## 2023-08-08 ENCOUNTER — Encounter: Payer: Self-pay | Admitting: Family Medicine

## 2023-08-08 VITALS — BP 160/80 | HR 100 | Temp 98.0°F | Resp 16 | Ht 70.0 in | Wt 187.0 lb

## 2023-08-08 DIAGNOSIS — M1A9XX0 Chronic gout, unspecified, without tophus (tophi): Secondary | ICD-10-CM

## 2023-08-08 DIAGNOSIS — Z1159 Encounter for screening for other viral diseases: Secondary | ICD-10-CM

## 2023-08-08 DIAGNOSIS — R55 Syncope and collapse: Secondary | ICD-10-CM | POA: Diagnosis not present

## 2023-08-08 DIAGNOSIS — I10 Essential (primary) hypertension: Secondary | ICD-10-CM | POA: Diagnosis not present

## 2023-08-08 MED ORDER — AMLODIPINE BESYLATE 10 MG PO TABS
10.0000 mg | ORAL_TABLET | Freq: Every day | ORAL | 1 refills | Status: DC
Start: 2023-08-08 — End: 2023-12-14

## 2023-08-08 MED ORDER — ALLOPURINOL 100 MG PO TABS: 200.0000 mg | ORAL_TABLET | Freq: Every day | ORAL | 1 refills | Status: DC

## 2023-08-08 NOTE — Patient Instructions (Addendum)
 Give us  2-3 business days to get the results of your labs back.   Keep the diet clean and stay active.  Keep an eye on your blood pressure at home. Let me know if it doesn't come back down when you resume your amlodipine .   Foods to AVOID: Red meat, organ meat (liver), lunch meat, seafood (mussels, scallops, anchovies, etc) Alcohol Sugary foods/beverages (diet soft drinks have no link to flares)  Foods to migrate to: Dairy Vegetables Cherries have limited data to suggest they help lower uric acid levels (and prevent flares) Vit C (500 mg daily) may have a modest effect with preventing flares Poultry If you are going to eat red meat, beef and pork may give you less problems than lamb.  Let us  know if you need anything.

## 2023-08-08 NOTE — Progress Notes (Signed)
 Chief Complaint  Patient presents with   Medication Refill    Medication Refill    Miguel Harrison is a 53 y.o. male here for gout.  Currently being treated with allopurinol  200 mg daily. The joint(s) affected include: Base of great toe Most recent uric acid will be checked her blood pressure level is: 4.1 Reports compliance. Side effects of medications: None Is avoiding seafood, sweet/sugary beverages, alcohol, and red meats.  Hypertension Patient presents for hypertension follow up. He does monitor home blood pressures. Blood pressures ranging on average from 130's/70-80's, He is compliant with medication- Norvasc  10 mg/d- ran out 2 weeks ago Patient has these side effects of medication: none He is usually adhering to a healthy diet overall. Exercise: walking No Cp or SOB.   2 weeks ago, he lost consciousness and ended up at the bottom of the stairs.  His pain is improving.  He does not have any sweating, chest pain, shortness of breath, or lightheadedness with exertion or at rest.  He may have been drunk during this episode.  He is working to decrease his alcohol consumption.  Physically he is starting to feel better but is curious as to why this happened.  Past Medical History:  Diagnosis Date   Gout    Hypertension    Sickle cell trait (HCC)     BP (!) 160/80 (BP Location: Right Arm, Patient Position: Sitting)   Pulse 100   Temp 98 F (36.7 C) (Oral)   Resp 16   Ht 5' 10 (1.778 m)   Wt 187 lb (84.8 kg)   SpO2 95%   BMI 26.83 kg/m  Gen: Awake, alert, appears stated age Neck: No masses or asymmetry Heart: RRR, no murmurs, no bruits, no LE edema Lungs: CTAB, no accessory muscle use MSK: no swelling or TTP Neuro: DTRs equal and symmetric throughout, no clonus, no cerebellar signs, normal gait Skin: No erythema or warmth Psych: Age appropriate judgment and insight, nml mood and affect  Chronic gout involving toe without tophus, unspecified cause, unspecified  laterality - Plan: Uric acid, allopurinol  (ZYLOPRIM ) 100 MG tablet  Essential hypertension - Plan: Comprehensive metabolic panel with GFR, Lipid panel, amLODipine  (NORVASC ) 10 MG tablet  Need for hepatitis B screening test - Plan: Hepatitis B surface antibody,quantitative  Syncope and collapse - Plan: MR Brain Wo Contrast  Chronic, hopefully stable.  Check labs.  Continue allopurinol  200 mg daily. Reminded to avoid foods like alcohol, sweet beverages, red meat, lunch meat, sea food. Chronic, not currently stable but he has been off of his medication for the past 2 weeks.  Restart amlodipine  10 mg daily.  Send message if this is not resolving issue.  Counseled on diet and exercise. Screen hep B. An episode where he passed out.  With history of questionable compliance, will check an MRI to rule out any possible stroke versus other sequelae.  Does not seem cardiac in nature. F/u in 6 months for physical or as needed. The patient voiced understanding and agreement to the plan.  [name] Mt [place], DO 08/08/23 3:57 PM

## 2023-08-09 ENCOUNTER — Ambulatory Visit: Payer: Self-pay | Admitting: Family Medicine

## 2023-08-09 LAB — HEPATITIS B SURFACE ANTIBODY, QUANTITATIVE: Hep B S AB Quant (Post): <5 m[IU]/mL — ABNORMAL LOW (ref >=10)

## 2023-08-09 NOTE — Addendum Note (Signed)
 Addended by: TRUDY CURVIN RAMAN on: 08/09/2023 03:10 PM   Modules accepted: Orders

## 2023-08-10 ENCOUNTER — Telehealth: Payer: Self-pay

## 2023-08-10 NOTE — Telephone Encounter (Signed)
 Called pt was advised imaging is unlikely to be cover, and he if willing to pay out of pocket. Pt stated he will wait to see how the other test turns out.

## 2023-08-10 NOTE — Telephone Encounter (Signed)
 Copied from CRM [id number]. Topic: Clinical - Request for Lab/Test Order >> Aug 10, 2023  3:38 PM Fonda T wrote: Reason for CRM: Patient calling, states he is scheduled for an imaging MRI appointment on 08/20/23. Patient states he thought it was for both head and neck, however per confirmation of appointment, he states imaging is only for his head.  Patient is requesting to have an order for imaging of his neck as well, due to neck stiffness.  Patient can be reached at [phone] To discuss further.

## 2023-08-15 ENCOUNTER — Other Ambulatory Visit

## 2023-08-16 ENCOUNTER — Ambulatory Visit (INDEPENDENT_AMBULATORY_CARE_PROVIDER_SITE_OTHER): Admitting: *Deleted

## 2023-08-16 ENCOUNTER — Ambulatory Visit: Payer: Self-pay

## 2023-08-16 ENCOUNTER — Ambulatory Visit

## 2023-08-16 ENCOUNTER — Other Ambulatory Visit

## 2023-08-16 DIAGNOSIS — Z23 Encounter for immunization: Secondary | ICD-10-CM

## 2023-08-16 NOTE — Progress Notes (Signed)
 Patient here for Heplisav B per Dr. Otila  Vaccine given in right deltoid and patient tolerated well.

## 2023-08-16 NOTE — Telephone Encounter (Signed)
  FYI Only or Action Required?: Action required by provider: update on patient condition.  Patient was last seen in primary care on 08/08/2023 by Frann [name] Mt, DO.  Called Nurse Triage reporting Neck Pain, Loss of Consciousness, and Fall.  Symptoms began a week ago.  Interventions attempted: OTC medications: ibuprofen/tylenol .  Symptoms are: unchanged.  Triage Disposition: See PCP When Office is Open (Within 3 Days)  Patient/caregiver understands and will follow disposition?: Yes  Copied from CRM [id number]. Topic: Clinical - Red Word Triage >> Aug 16, 2023  1:30 PM Mesmerise C wrote: [facility] that prompted transfer to Nurse Triage: Patient is having stiffness in his neck with some pain noticed it last week, patient stated he fell and it caused the pain inquiring about getting medication for it was told there's patches to help Reason for Disposition  [1] MODERATE neck pain (e.g., interferes with normal activities) AND [2] present > 3 days  Answer Assessment - Initial Assessment Questions 1. ONSET: When did the pain begin?      About three days ago 2. LOCATION: Where does it hurt?      Both sides of neck 3. PATTERN Does the pain come and go, or has it been constant since it started?      Pain comes and goes 4. SEVERITY: How bad is the pain?  (Scale 1-10; or mild, moderate, severe)   - NO PAIN (0): no pain or only slight stiffness    - MILD (1-3): doesn't interfere with normal activities    - MODERATE (4-7): interferes with normal activities or awakens from sleep    - SEVERE (8-10):  excruciating pain, unable to do any normal activities      Patient states that he feels ok right now, but pain is 5/10 at worse 5. RADIATION: Does the pain go anywhere else, shoot into your arms?     denies 6. CORD SYMPTOMS: Any weakness or numbness of the arms or legs?     denies 7. CAUSE: What do you think is causing the neck pain?     Fall over one week ago  Patient would  like something called in for his neck pain, declines appointment at this time  Protocols used: Neck Pain or Stiffness-A-AH

## 2023-08-20 ENCOUNTER — Ambulatory Visit (HOSPITAL_COMMUNITY): Admission: RE | Admit: 2023-08-20 | Source: Ambulatory Visit

## 2023-09-16 ENCOUNTER — Ambulatory Visit

## 2023-12-06 ENCOUNTER — Ambulatory Visit: Admitting: Family Medicine

## 2023-12-09 ENCOUNTER — Ambulatory Visit: Admitting: Family Medicine

## 2023-12-14 ENCOUNTER — Encounter: Payer: Self-pay | Admitting: Family Medicine

## 2023-12-14 ENCOUNTER — Ambulatory Visit (INDEPENDENT_AMBULATORY_CARE_PROVIDER_SITE_OTHER): Admitting: Family Medicine

## 2023-12-14 VITALS — BP 144/76 | HR 100 | Temp 98.0°F | Resp 16 | Ht 70.0 in | Wt 183.8 lb

## 2023-12-14 DIAGNOSIS — M1A9XX0 Chronic gout, unspecified, without tophus (tophi): Secondary | ICD-10-CM | POA: Diagnosis not present

## 2023-12-14 DIAGNOSIS — I10 Essential (primary) hypertension: Secondary | ICD-10-CM

## 2023-12-14 DIAGNOSIS — R2243 Localized swelling, mass and lump, lower limb, bilateral: Secondary | ICD-10-CM | POA: Diagnosis not present

## 2023-12-14 DIAGNOSIS — G5621 Lesion of ulnar nerve, right upper limb: Secondary | ICD-10-CM

## 2023-12-14 MED ORDER — OLMESARTAN MEDOXOMIL 20 MG PO TABS
20.0000 mg | ORAL_TABLET | Freq: Every day | ORAL | 2 refills | Status: AC
Start: 2023-12-14 — End: ?

## 2023-12-14 NOTE — Progress Notes (Signed)
 Chief Complaint  Patient presents with   Diabetes    Diabetes Check    Subjective Miguel Harrison is a 53 y.o. male who presents for hypertension follow up. He does not monitor home blood pressures. He is compliant with medication- Norvasc  10 mg/d. Patient has these side effects of medication: none noted He is sometimes adhering to a healthy diet overall. Current exercise: active at work No CP or SOB.    Gout Taking allopurinol  200 mg/d. Compliant, no AE's. No gout flares over past 2 years. Usually affects his big toe, both R and L.   2 weeks of numbness in R pinky and swelling in both legs. Breathing well. Sodium intake is overall low, unchanged. Norvasc  10 mg/d as above.  Past Medical History:  Diagnosis Date   Gout    Hypertension    Sickle cell trait     Exam BP (!) 144/76 (BP Location: Left Arm, Cuff Size: Normal)   Pulse 100   Temp 98 F (36.7 C) (Oral)   Resp 16   Ht 5' 10 (1.778 m)   Wt 183 lb 12.8 oz (83.4 kg)   SpO2 96%   BMI 26.37 kg/m  General:  well developed, well nourished, in no apparent distress Heart: RRR, no bruits, 2+ pitting b/l LE edema tapering at knees Lungs: clear to auscultation, no accessory muscle use Psych: well oriented with normal range of affect and appropriate judgment/insight  Essential hypertension - Plan: CBC, Comprehensive metabolic panel with GFR, olmesartan (BENICAR) 20 MG tablet  Chronic gout involving toe without tophus, unspecified cause, unspecified laterality - Plan: Uric acid  Ulnar neuropathy of right upper extremity  Localized swelling, mass, or lump of lower extremity, bilateral  Chronic, not controlled.  Stop Norvasc , start Benicar 20 mg daily.  Check labs.  Monitor blood pressure at home.  Counseled on diet and exercise. Chronic, stable.  Continue allopurinol  200 mg daily. Wrist brace given.  If no improvement in next month, we will set him up with the hand team. Adverse effect of chronic medication.  Stop  Norvasc  as above, Benicar to replace. F/u in in 1 mo to reck. The patient voiced understanding and agreement to the plan.  [name] Mt [place], DO 12/14/23  1:24 PM

## 2023-12-14 NOTE — Patient Instructions (Addendum)
 Give us  2-3 business days to get the results of your labs back.   Keep the diet clean and stay active.  Wear the brace at night and during aggravating activities.   Check your blood pressures 2-3 times per week, alternating the time of day you check it. If it is high, considering waiting 1-2 minutes and rechecking. If it gets higher, your anxiety is likely creeping up and we should avoid rechecking.    Let us  know if you need anything.

## 2023-12-15 ENCOUNTER — Other Ambulatory Visit: Payer: Self-pay | Admitting: *Deleted

## 2023-12-15 ENCOUNTER — Ambulatory Visit (INDEPENDENT_AMBULATORY_CARE_PROVIDER_SITE_OTHER)

## 2023-12-15 ENCOUNTER — Ambulatory Visit: Payer: Self-pay | Admitting: Family Medicine

## 2023-12-15 DIAGNOSIS — R718 Other abnormality of red blood cells: Secondary | ICD-10-CM | POA: Diagnosis not present

## 2023-12-15 LAB — CBC
HCT: 35.1 % — ABNORMAL LOW (ref 39.0–52.0)
Hemoglobin: 11.9 g/dL — ABNORMAL LOW (ref 13.0–17.0)
MCHC: 33.8 g/dL (ref 30.0–36.0)
MCV: 111.2 fl — ABNORMAL HIGH (ref 78.0–100.0)
Platelets: 89 K/uL — ABNORMAL LOW (ref 150.0–400.0)
RBC: 3.16 Mil/uL — ABNORMAL LOW (ref 4.22–5.81)
RDW: 14.2 % (ref 11.5–15.5)
WBC: 5.9 K/uL (ref 4.0–10.5)

## 2023-12-15 LAB — COMPREHENSIVE METABOLIC PANEL WITH GFR
ALT: 41 U/L (ref 0–53)
AST: 74 U/L — ABNORMAL HIGH (ref 0–37)
Albumin: 3.9 g/dL (ref 3.5–5.2)
Alkaline Phosphatase: 95 U/L (ref 39–117)
BUN: 8 mg/dL (ref 6–23)
CO2: 26 meq/L (ref 19–32)
Calcium: 9 mg/dL (ref 8.4–10.5)
Chloride: 96 meq/L (ref 96–112)
Creatinine, Ser: 0.61 mg/dL (ref 0.40–1.50)
GFR: 108.14 mL/min (ref >60.00)
Glucose, Bld: 72 mg/dL (ref 70–99)
Potassium: 4.1 meq/L (ref 3.5–5.1)
Sodium: 134 meq/L — ABNORMAL LOW (ref 135–145)
Total Bilirubin: 0.8 mg/dL (ref 0.2–1.2)
Total Protein: 7.6 g/dL (ref 6.0–8.3)

## 2023-12-15 LAB — B12 AND FOLATE PANEL
Folate: 3.6 ng/mL — ABNORMAL LOW (ref >5.9)
Vitamin B-12: 264 pg/mL (ref 211–911)

## 2023-12-15 LAB — URIC ACID: Uric Acid, Serum: 4.6 mg/dL (ref 4.0–7.8)

## 2024-01-13 ENCOUNTER — Ambulatory Visit: Admitting: Family Medicine

## 2024-02-01 ENCOUNTER — Other Ambulatory Visit: Payer: Self-pay | Admitting: Family Medicine

## 2024-02-01 DIAGNOSIS — M1A9XX0 Chronic gout, unspecified, without tophus (tophi): Secondary | ICD-10-CM

## 2024-02-17 ENCOUNTER — Ambulatory Visit: Admitting: Family Medicine

## 2024-03-14 ENCOUNTER — Ambulatory Visit: Admitting: Family Medicine
